# Patient Record
Sex: Female | Born: 1975 | Race: White | Hispanic: No | Marital: Married | Smoking: Former smoker
Health system: Southern US, Community
[De-identification: ages and names within clinical notes are randomized; demographics above are authoritative.]

## PROBLEM LIST (undated history)

## (undated) DIAGNOSIS — Z860100 Personal history of colon polyps, unspecified: Secondary | ICD-10-CM

## (undated) DIAGNOSIS — N8501 Benign endometrial hyperplasia: Secondary | ICD-10-CM

## (undated) DIAGNOSIS — K219 Gastro-esophageal reflux disease without esophagitis: Secondary | ICD-10-CM

## (undated) DIAGNOSIS — K529 Noninfective gastroenteritis and colitis, unspecified: Secondary | ICD-10-CM

## (undated) DIAGNOSIS — K859 Acute pancreatitis without necrosis or infection, unspecified: Secondary | ICD-10-CM

## (undated) DIAGNOSIS — R945 Abnormal results of liver function studies: Secondary | ICD-10-CM

## (undated) HISTORY — DX: Benign endometrial hyperplasia: N85.01

## (undated) HISTORY — PX: TUBAL LIGATION: SHX77

## (undated) HISTORY — PX: CHOLECYSTECTOMY: SHX55

## (undated) HISTORY — DX: Acute pancreatitis without necrosis or infection, unspecified: K85.90

## (undated) HISTORY — PX: TONSILLECTOMY: SUR1361

## (undated) HISTORY — DX: Noninfective gastroenteritis and colitis, unspecified: K52.9

## (undated) HISTORY — DX: Personal history of colon polyps, unspecified: Z86.0100

## (undated) HISTORY — DX: Personal history of colonic polyps: Z86.010

## (undated) HISTORY — PX: WISDOM TOOTH EXTRACTION: SHX21

---

## 1998-08-05 ENCOUNTER — Encounter: Admission: RE | Admit: 1998-08-05 | Discharge: 1998-11-03 | Payer: Self-pay | Admitting: Gynecology

## 1998-09-24 ENCOUNTER — Inpatient Hospital Stay (HOSPITAL_COMMUNITY): Admission: AD | Admit: 1998-09-24 | Discharge: 1998-09-24 | Payer: Self-pay | Admitting: *Deleted

## 1998-10-02 ENCOUNTER — Inpatient Hospital Stay (HOSPITAL_COMMUNITY): Admission: AD | Admit: 1998-10-02 | Discharge: 1998-10-02 | Payer: Self-pay | Admitting: Obstetrics and Gynecology

## 1998-10-22 ENCOUNTER — Inpatient Hospital Stay (HOSPITAL_COMMUNITY): Admission: AD | Admit: 1998-10-22 | Discharge: 1998-10-25 | Payer: Self-pay | Admitting: Gynecology

## 1999-12-08 ENCOUNTER — Other Ambulatory Visit: Admission: RE | Admit: 1999-12-08 | Discharge: 1999-12-08 | Payer: Self-pay | Admitting: Gynecology

## 2000-06-14 ENCOUNTER — Inpatient Hospital Stay (HOSPITAL_COMMUNITY): Admission: AD | Admit: 2000-06-14 | Discharge: 2000-06-14 | Payer: Self-pay | Admitting: Gynecology

## 2001-05-23 ENCOUNTER — Encounter (INDEPENDENT_AMBULATORY_CARE_PROVIDER_SITE_OTHER): Payer: Self-pay

## 2002-12-16 ENCOUNTER — Encounter: Admission: RE | Admit: 2002-12-16 | Discharge: 2002-12-16 | Payer: Self-pay | Admitting: Internal Medicine

## 2003-02-10 ENCOUNTER — Inpatient Hospital Stay (HOSPITAL_COMMUNITY): Admission: AD | Admit: 2003-02-10 | Discharge: 2003-02-10 | Payer: Self-pay | Admitting: Gynecology

## 2004-12-29 ENCOUNTER — Other Ambulatory Visit: Admission: RE | Admit: 2004-12-29 | Discharge: 2004-12-29 | Payer: Self-pay | Admitting: Gynecology

## 2005-05-03 ENCOUNTER — Encounter: Admission: RE | Admit: 2005-05-03 | Discharge: 2005-08-01 | Payer: Self-pay | Admitting: Gynecology

## 2005-07-19 ENCOUNTER — Inpatient Hospital Stay (HOSPITAL_COMMUNITY): Admission: AD | Admit: 2005-07-19 | Discharge: 2005-07-19 | Payer: Self-pay | Admitting: Gynecology

## 2005-08-02 ENCOUNTER — Inpatient Hospital Stay (HOSPITAL_COMMUNITY): Admission: RE | Admit: 2005-08-02 | Discharge: 2005-08-05 | Payer: Self-pay | Admitting: Gynecology

## 2005-08-02 ENCOUNTER — Encounter (INDEPENDENT_AMBULATORY_CARE_PROVIDER_SITE_OTHER): Payer: Self-pay | Admitting: *Deleted

## 2005-09-16 ENCOUNTER — Other Ambulatory Visit: Admission: RE | Admit: 2005-09-16 | Discharge: 2005-09-16 | Payer: Self-pay | Admitting: Gynecology

## 2005-10-03 ENCOUNTER — Emergency Department (HOSPITAL_COMMUNITY): Admission: EM | Admit: 2005-10-03 | Discharge: 2005-10-03 | Payer: Self-pay | Admitting: Emergency Medicine

## 2005-11-07 DIAGNOSIS — K859 Acute pancreatitis without necrosis or infection, unspecified: Secondary | ICD-10-CM

## 2005-11-07 HISTORY — DX: Acute pancreatitis without necrosis or infection, unspecified: K85.90

## 2006-09-19 ENCOUNTER — Other Ambulatory Visit: Admission: RE | Admit: 2006-09-19 | Discharge: 2006-09-19 | Payer: Self-pay | Admitting: Gynecology

## 2007-09-21 ENCOUNTER — Other Ambulatory Visit: Admission: RE | Admit: 2007-09-21 | Discharge: 2007-09-21 | Payer: Self-pay | Admitting: Gynecology

## 2007-11-08 DIAGNOSIS — K529 Noninfective gastroenteritis and colitis, unspecified: Secondary | ICD-10-CM

## 2007-11-08 HISTORY — DX: Noninfective gastroenteritis and colitis, unspecified: K52.9

## 2007-12-16 ENCOUNTER — Inpatient Hospital Stay: Payer: Self-pay | Admitting: Internal Medicine

## 2008-09-24 ENCOUNTER — Encounter: Payer: Self-pay | Admitting: Women's Health

## 2008-09-24 ENCOUNTER — Ambulatory Visit: Payer: Self-pay | Admitting: Women's Health

## 2008-09-24 ENCOUNTER — Other Ambulatory Visit: Admission: RE | Admit: 2008-09-24 | Discharge: 2008-09-24 | Payer: Self-pay | Admitting: Gynecology

## 2009-09-25 ENCOUNTER — Other Ambulatory Visit: Admission: RE | Admit: 2009-09-25 | Discharge: 2009-09-25 | Payer: Self-pay | Admitting: Gynecology

## 2009-09-25 ENCOUNTER — Ambulatory Visit: Payer: Self-pay | Admitting: Gynecology

## 2009-09-28 ENCOUNTER — Ambulatory Visit: Payer: Self-pay | Admitting: Gynecology

## 2009-10-14 IMAGING — CT CT ABD-PELV W/O CM
2 of 3 series · 16 of 42 positions shown, 19 images · non-contrast
Comparison: none

REASON FOR EXAM: (1) rlq pain; (2) rlq pain
COMMENTS:

[Series 2: stone · axial · 0.75mm/px · z∈[-950,-504]mm · 13 of 167 slices shown, 16 images]
[im 12/167  soft-tissue]
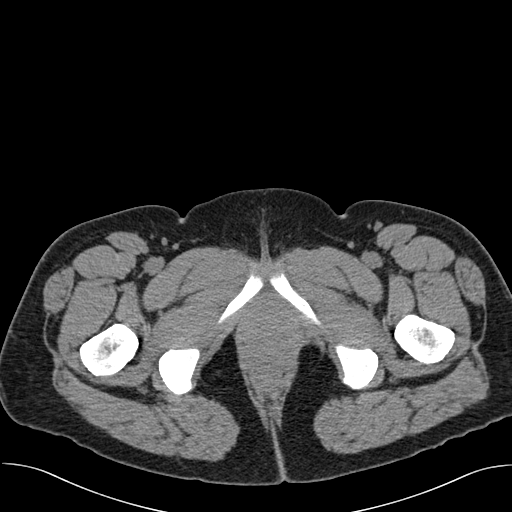
[im 12/167  bone]
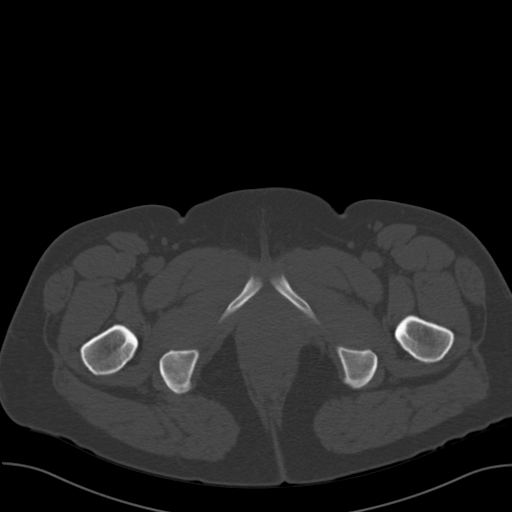
[im 30/167  soft-tissue]
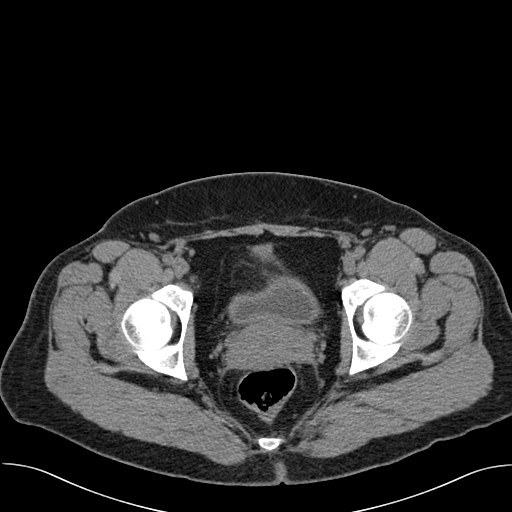
[im 42/167  soft-tissue]
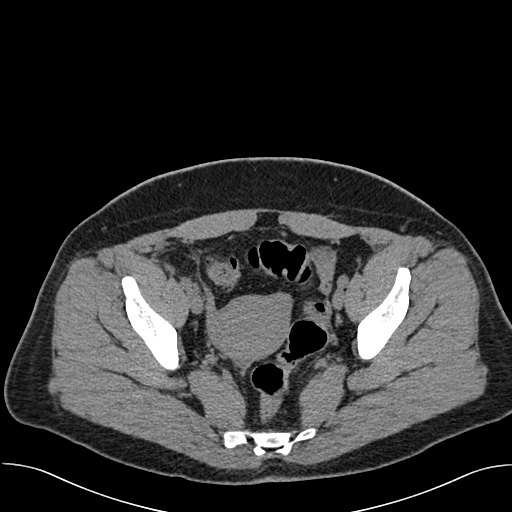
[im 60/167  soft-tissue]
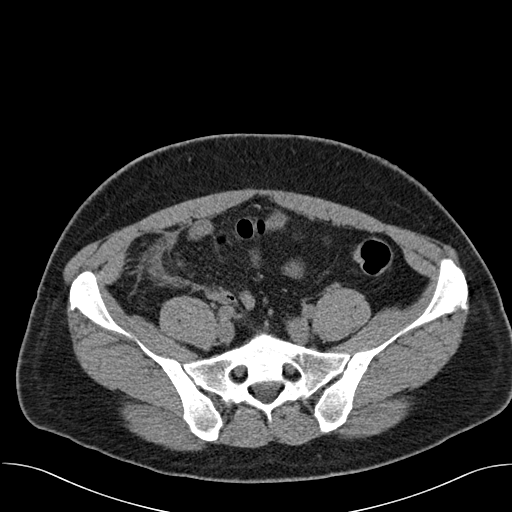
[im 78/167  soft-tissue]
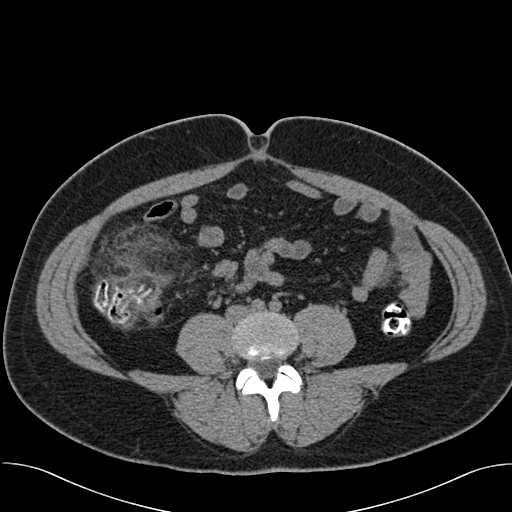
[im 89/167  soft-tissue]
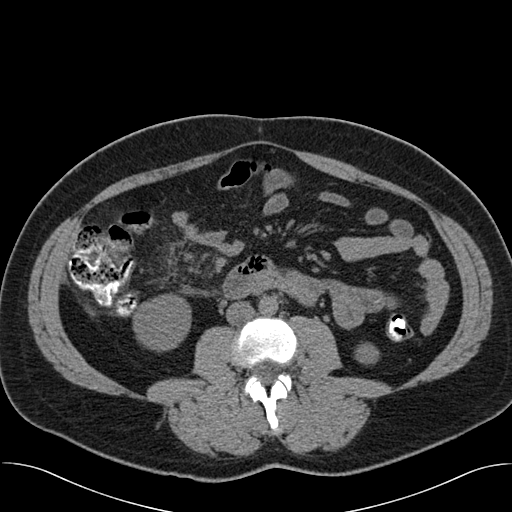
[im 107/167  soft-tissue]
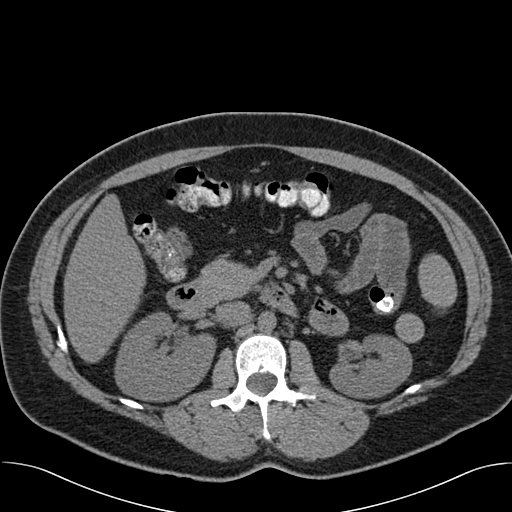
[im 125/167  soft-tissue]
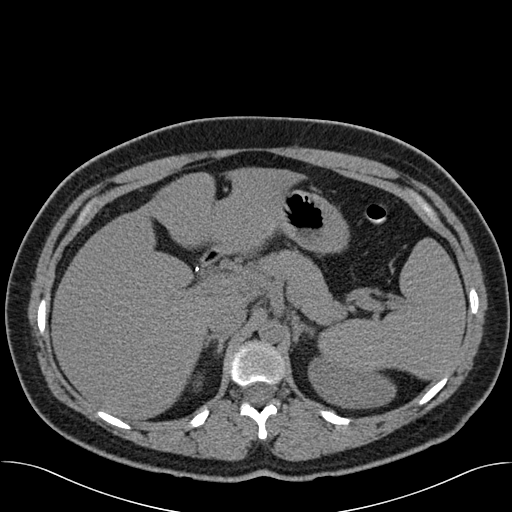
[im 137/167  soft-tissue]
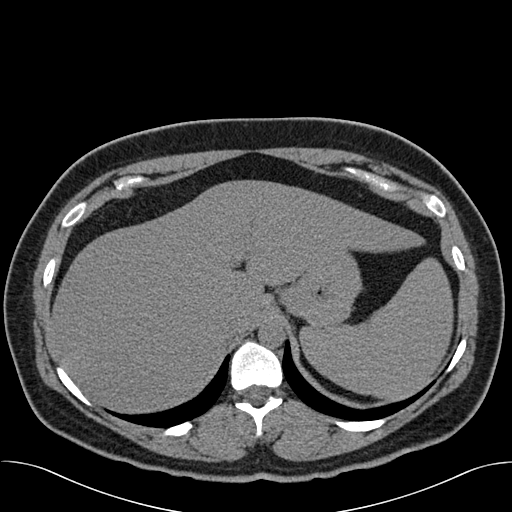
[im 137/167  bone]
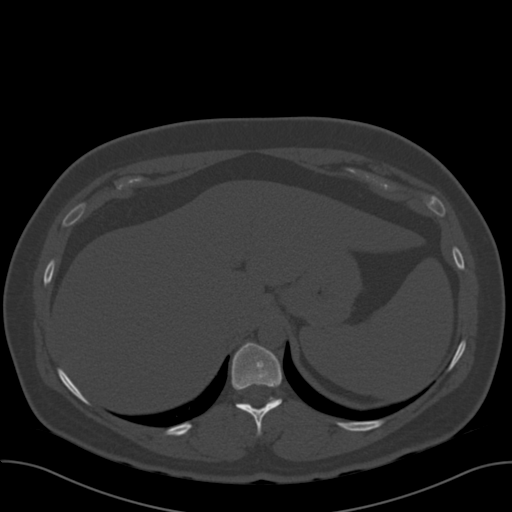
[im 143/167  lung]
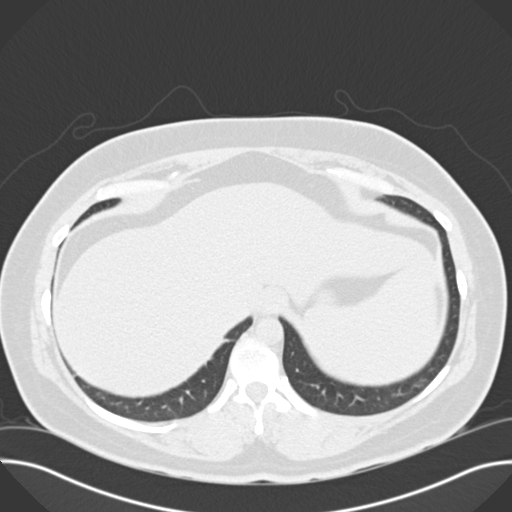
[im 149/167  lung]
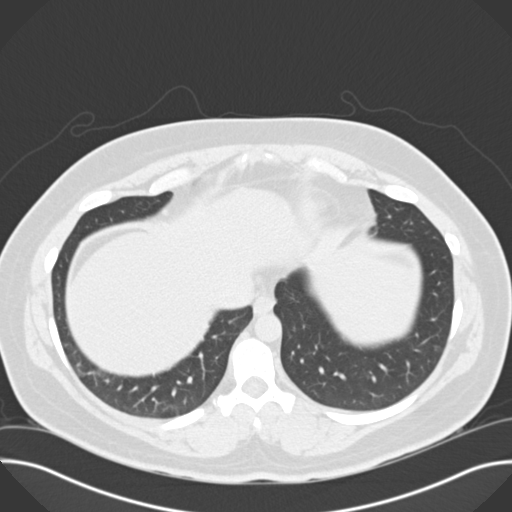
[im 155/167  soft-tissue]
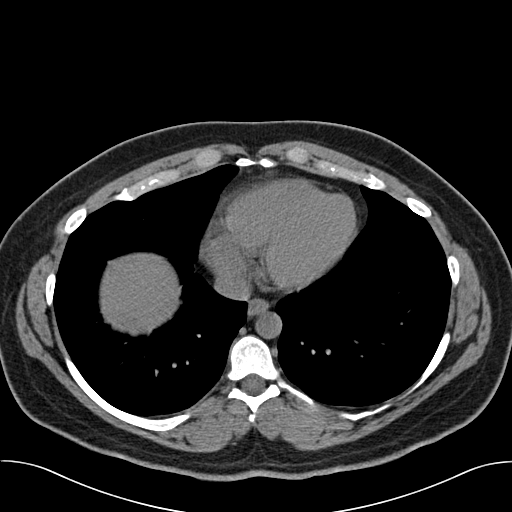
[im 155/167  lung]
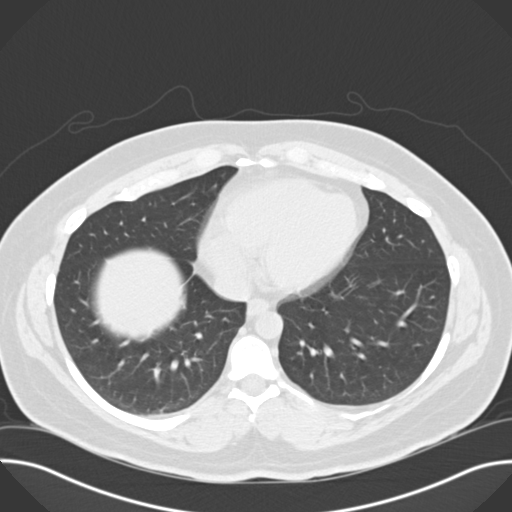
[im 161/167  lung]
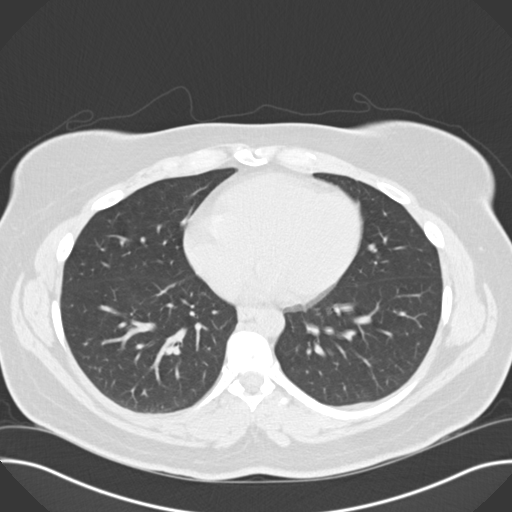

[Series 602: coronals · coronal · 0.99mm/px · 3 of 85 slices shown]
[im 29/85  soft-tissue]
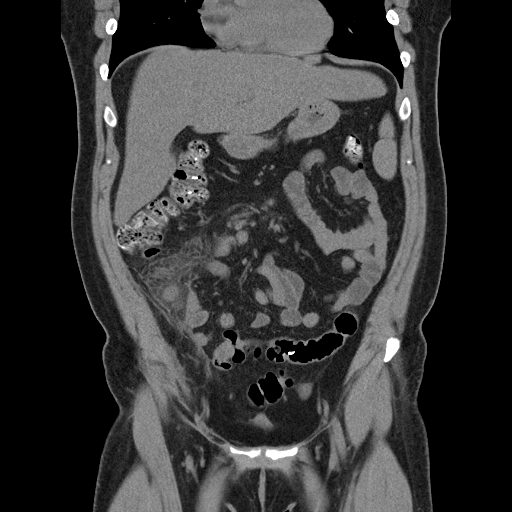
[im 38/85  soft-tissue]
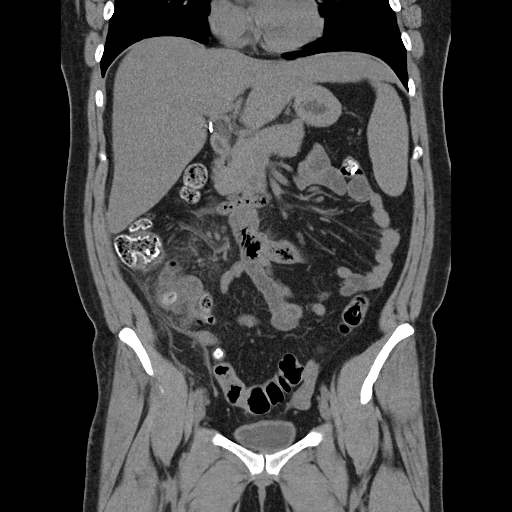
[im 47/85  soft-tissue]
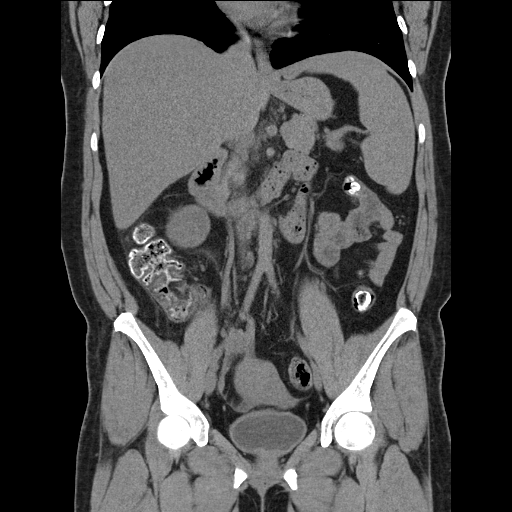

[16 of 42 positions shown; findings below may reference images not displayed]

PROCEDURE:     CT  - CT ABDOMEN AND PELVIS W[DATE]  [DATE]

RESULT:     Noncontrast emergent CT of the abdomen and pelvis is performed.
Images are reconstructed at 3 mm slice thickness in the axial plane. There
is no previous exam for comparison.

The images demonstrate extensive inflammatory change in the anterior
pericecal region. This is most pronounced on images 95 through 100. The
changes could be secondary to diverticulitis or epiploic appendagitis..
Appendicitis is felt to be unlikely. It appears that there is a normal
appearing appendix from image 105 to 112 extending medially from the cecal
tip almost to the midline on image 110. There is some density within loops
of bowel likely secondary to antacid use. There is no definite perforation
or abscess at this time. The possibility of an inflammatory process such as
colitis even Crohn's colitis could be considered but given the asymmetric
localization anterior to the cecum that is felt to be less likely.

The uterus is present to the right of midline. There is no free fluid or
free air. There is no abnormal bowel distention. The abdominal aorta is
normal in caliber. The kidneys are unremarkable. Cholecystectomy clips are
present. The spleen is borderline enlarged. The liver, pancreas and adrenal
glands are unremarkable. The lung bases are clear.
IMPRESSION: 1. Prominent inflammatory response in the anterior pericecal region which
may be secondary to diverticulitis or epiploic appendagitis.  . Appendicitis
is felt to be less likely. There appears to be a normal appendix medial to
the cecum without definite evidence of periappendiceal inflammatory change.
Colitis is also a consideration but is also felt to be less likely.

2. Cholecystectomy changes.

## 2009-11-07 DIAGNOSIS — N8501 Benign endometrial hyperplasia: Secondary | ICD-10-CM

## 2009-11-07 HISTORY — DX: Benign endometrial hyperplasia: N85.01

## 2010-05-19 ENCOUNTER — Encounter: Admission: RE | Admit: 2010-05-19 | Discharge: 2010-05-20 | Payer: Self-pay | Admitting: Obstetrics and Gynecology

## 2010-07-09 ENCOUNTER — Inpatient Hospital Stay (HOSPITAL_COMMUNITY): Admission: AD | Admit: 2010-07-09 | Discharge: 2010-07-09 | Payer: Self-pay | Admitting: Obstetrics and Gynecology

## 2010-07-16 ENCOUNTER — Other Ambulatory Visit: Payer: Self-pay | Admitting: Obstetrics and Gynecology

## 2010-07-21 ENCOUNTER — Inpatient Hospital Stay (HOSPITAL_COMMUNITY): Admission: AD | Admit: 2010-07-21 | Discharge: 2010-07-23 | Payer: Self-pay | Admitting: Obstetrics and Gynecology

## 2011-01-20 LAB — CBC
HCT: 29.2 % — ABNORMAL LOW (ref 36.0–46.0)
Hemoglobin: 10.5 g/dL — ABNORMAL LOW (ref 12.0–15.0)
Hemoglobin: 12.6 g/dL (ref 12.0–15.0)
MCH: 34.5 pg — ABNORMAL HIGH (ref 26.0–34.0)
MCHC: 35 g/dL (ref 30.0–36.0)
Platelets: 131 10*3/uL — ABNORMAL LOW (ref 150–400)
RBC: 3.04 MIL/uL — ABNORMAL LOW (ref 3.87–5.11)
RDW: 13.7 % (ref 11.5–15.5)
RDW: 13.9 % (ref 11.5–15.5)
WBC: 7.8 10*3/uL (ref 4.0–10.5)
WBC: 8 10*3/uL (ref 4.0–10.5)

## 2011-01-20 LAB — GLUCOSE, CAPILLARY
Glucose-Capillary: 105 mg/dL — ABNORMAL HIGH (ref 70–99)
Glucose-Capillary: 130 mg/dL — ABNORMAL HIGH (ref 70–99)
Glucose-Capillary: 63 mg/dL — ABNORMAL LOW (ref 70–99)

## 2011-01-20 LAB — BASIC METABOLIC PANEL
Calcium: 8.7 mg/dL (ref 8.4–10.5)
Chloride: 104 mEq/L (ref 96–112)
Creatinine, Ser: 0.46 mg/dL (ref 0.4–1.2)
GFR calc Af Amer: >60 mL/min (ref >60)

## 2011-01-20 LAB — SURGICAL PCR SCREEN
MRSA, PCR: NEGATIVE
Staphylococcus aureus: NEGATIVE

## 2011-01-20 LAB — RPR
RPR Ser Ql: NONREACTIVE
RPR Ser Ql: NONREACTIVE

## 2011-03-25 NOTE — H&P (Signed)
Regina Clark, Regina Clark                ACCOUNT NO.:  [id number]   MEDICAL RECORD NO.:  [id number]          PATIENT TYPE:  INP   LOCATION:  NA                            FACILITY:  [facility]   PHYSICIAN:  Timothy P. Fontaine, M.D.DATE OF BIRTH:  04-15-1976   DATE OF ADMISSION:  DATE OF DISCHARGE:                                HISTORY & PHYSICAL   CHIEF COMPLAINT:  1.  Pregnancy at term.  2.  Gestational diabetes, diet controlled.  3.  Fetal macrosomia.   HISTORY OF PRESENT ILLNESS:  This is a 35 year old G2, P69 female at 74  weeks' gestation who was found on clinical exam to have a size greater than  dates, underwent ultrasound screening and was found to have an estimated  fetal weight of 4500 g.  The patient is being followed for gestational  diabetes, diet controlled, has a reactive NST, normal AFI, and the issues of  trial of labor versus primary cesarean section due to fetal macrosomia, risk  of shoulder dystocia, and fetal injury were all discussed with her.  Her  first pregnancy was delivered at 7 pounds 2 ounces, was noted historically  to have some difficulty with delivery requiring forceps, and after a lengthy  discussion the patient elects for a primary cesarean section.  For the  remainder of her history see her [place].   PHYSICAL EXAMINATION:  HEENT:  Normal.  LUNGS:  Clear.  CARDIAC:  Regular rate.  No rubs, murmurs, or gallops.  ABDOMEN:  Exam benign with a gravid vertex fetus.  External monitor is  reactive fetal tracing, irregular contractions.  PELVIC:  Deferred.   ASSESSMENT:  55.  A 35 year old G2, P35 female, large infant on Leopold's.  Ultrasound      confirms 4500 g fetus.  2.  History of gestational diabetes.  3.  History of difficult delivery with 7 pound 2 ounce prior birth.      Reviewed options, risks, benefits of cesarean section versus attempted      vaginal delivery and the patient elects for a cesarean section.  I      reviewed what is involved with the  procedure, expected intraoperative      and postoperative courses and the risks include infection, both      internal, __________antibiotics, incisional requiring opening and      draining of incisions and closure by secondary intention.  The risks of      hemorrhage necessitating transfusion and the risks of transfusion      including transfusion reaction, hepatitis, HIV, mad cow disease, and      other unknown entities were discussed, understood, and accepted.  Risks      of inadvertent injury to internal organs including bowel, bladder,      ureters, vessels, and nerves necessitating major exploratory reparative      surgeries and future reparative surgeries including ostomy      formation, was all discussed, understood, and accepted.  The risks of      fetal injury including musculoskeletal, neural, scalpel injuries were      all discussed, understood,  and accepted.  The patient's questions were      answered to her satisfaction and she is ready to proceed with surgery.      Timothy P. Fontaine, M.D.  Electronically Signed     TPF/MEDQ  D:  08/01/2005  T:  08/01/2005  Job:  [id number]

## 2011-03-25 NOTE — H&P (Signed)
NAMEDARYL, QUIROS                ACCOUNT NO.:  [id number]   MEDICAL RECORD NO.:  [id number]          PATIENT TYPE:  INP   LOCATION:  9134                          FACILITY:  [facility]   PHYSICIAN:  Juan H. Lily Peer, M.D.DATE OF BIRTH:  02-19-1976   DATE OF ADMISSION:  08/02/2005  DATE OF DISCHARGE:  08/05/2005                                HISTORY & PHYSICAL   HISTORY:  The patient is a 35 year old gravida 2, para 1 who underwent a  primary cesarean section secondary to gestational diabetes, suspected fetal  macrosomia, and at term.  Patient delivered a viable female infant, weight 9  pounds 13 ounces, Apgars at 9 and 9.  Postoperatively patient did well.  Her  first postoperative day her hemoglobin and hematocrit were 10.6 and 31.2.  Blood type was O+.  Rubella immune.  Her diet was gradually advanced.  Her  Foley catheter had been discontinued.  She was up and ambulating, ready to  be discharged home on the third postoperative day.   FINAL DIAGNOSES:  1.  Term intrauterine pregnancy.  2.  Gestational diabetes, diet controlled.  3.  Suspected fetal macrosomia.   PROCEDURES PERFORMED:  Primary lower uterine segment transverse cesarean  section by Dr. Colin Broach.   FINAL DISPOSITION:  Patient was discharged home on her third postoperative  day.  She was given a prescription for Lortab 7.5/500 to take one p.o. q.4-  6h. p.r.n. pain also interchanged with Motrin 800 mg t.i.d.  She was  encouraged to continue her prenatal vitamins and iron and follow up in  [facility] in six weeks for a postoperative visit.  Discharge  instructions were provided as well.      Juan H. Lily Peer, M.D.  Electronically Signed     JHF/MEDQ  D:  09/07/2005  T:  09/07/2005  Job:  [id number]

## 2011-03-25 NOTE — Op Note (Signed)
NAMEANNI, HOCEVAR                ACCOUNT NO.:  [id number]   MEDICAL RECORD NO.:  [id number]          PATIENT TYPE:  INP   LOCATION:  9134                          FACILITY:  [facility]   PHYSICIAN:  Timothy P. Fontaine, M.D.DATE OF BIRTH:  Dec 20, 1975   DATE OF PROCEDURE:  08/02/2005  DATE OF DISCHARGE:                                 OPERATIVE REPORT   PREOPERATIVE DIAGNOSES:  Term pregnancy, gestational diabetes, fetal  macrosomia.   POSTOPERATIVE DIAGNOSES:  Term pregnancy, gestational diabetes, fetal  macrosomia.   PROCEDURE:  Primary low transverse cervical cesarean section.   SURGEON:  Timothy P. Fontaine, M.D.   ASSISTANT:  Ivor Costa. Farrel Gobble, M.D.   ANESTHETIC:  Spinal.   ESTIMATED BLOOD LOSS:  Less than 500 mL.   COMPLICATIONS:  None.   SPECIMEN:  Samples of cord blood, cord blood banking, placenta.   FINDINGS:  At 70 a normal female, Apgars 09/09, weight 9 pounds 13 ounces.  Uterus not exteriorized, adnexa palpably normal, not visualized.   PROCEDURE:  The patient was taken to the operating room, underwent spinal  anesthesia was placed left tilt supine position, received abdominal  preparation with Betadine solution. Foley catheter placed in sterile  technique per nursing personnel. The patient was draped in usual fashion  after assuring adequate anesthesia. The abdomen sharply entered through a  Pfannenstiel incision achieving adequate hemostasis at all levels. Bladder  flap was sharply bluntly developed without difficulty. The uterus was  sharply entered in the lower uterine segment bluntly extended laterally. The  bulging membranes were ruptured. Fluid noted to be clear. The infant's head  delivered through the incision. Nares and mouth suctioned, rest of the  infant delivered. Cord doubly clamped and cut. Infant handed to pediatrics  in attendance. Samples of cord blood were obtained. Placenta was then  spontaneously extruded and passed off for cord blood banking  retrieval. The  endometrial cavity was explored with a sponge to remove all placental  membrane fragments. Due to the size of the uterus it was not exteriorized  and the uterine incision was subsequently closed in two layers using 0  Vicryl suture first a running interlocking stitch followed by imbricating  stitch. The pelvis was copiously irrigated. Adequate hemostasis was  visualized. The anterior fascia reapproximated using 0 Vicryl suture  in running stitch. Subcutaneous tissues irrigated. Adequate hemostasis  achieved with electrocautery. Skin reapproximated using 4-0 Vicryl running  subcuticular stitch. Steri-Strips, Benzoin applied. Sterile dressing  applied. The patient taken to recovery room in good condition having  tolerated procedure well      Timothy P. Fontaine, M.D.  Electronically Signed     TPF/MEDQ  D:  08/02/2005  T:  08/02/2005  Job:  [id number]

## 2011-12-14 ENCOUNTER — Encounter: Payer: Self-pay | Admitting: Gynecology

## 2011-12-19 ENCOUNTER — Encounter: Payer: Self-pay | Admitting: Gynecology

## 2011-12-19 DIAGNOSIS — N8501 Benign endometrial hyperplasia: Secondary | ICD-10-CM | POA: Insufficient documentation

## 2011-12-26 ENCOUNTER — Ambulatory Visit (INDEPENDENT_AMBULATORY_CARE_PROVIDER_SITE_OTHER): Payer: Managed Care, Other (non HMO) | Admitting: Gynecology

## 2011-12-26 ENCOUNTER — Other Ambulatory Visit (HOSPITAL_COMMUNITY)
Admission: RE | Admit: 2011-12-26 | Discharge: 2011-12-26 | Disposition: A | Discharge status: Home / Self Care | Payer: Managed Care, Other (non HMO) | Source: Ambulatory Visit | Attending: Gynecology | Admitting: Gynecology

## 2011-12-26 ENCOUNTER — Encounter: Payer: Self-pay | Admitting: Gynecology

## 2011-12-26 VITALS — BP 116/82 | Ht 66.25 in | Wt 221.0 lb

## 2011-12-26 DIAGNOSIS — Z01419 Encounter for gynecological examination (general) (routine) without abnormal findings: Secondary | ICD-10-CM

## 2011-12-26 DIAGNOSIS — Z13220 Encounter for screening for lipoid disorders: Secondary | ICD-10-CM

## 2011-12-26 DIAGNOSIS — K529 Noninfective gastroenteritis and colitis, unspecified: Secondary | ICD-10-CM | POA: Insufficient documentation

## 2011-12-26 DIAGNOSIS — Z131 Encounter for screening for diabetes mellitus: Secondary | ICD-10-CM

## 2011-12-26 DIAGNOSIS — Z86010 Personal history of colonic polyps: Secondary | ICD-10-CM | POA: Insufficient documentation

## 2011-12-26 LAB — LIPID PANEL
Cholesterol: 187 mg/dL (ref 0–200)
VLDL: 57 mg/dL — ABNORMAL HIGH (ref 0–40)

## 2011-12-26 LAB — URINALYSIS W MICROSCOPIC + REFLEX CULTURE
Bilirubin Urine: NEGATIVE
Specific Gravity, Urine: 1.02 (ref 1.005–1.030)
Urobilinogen, UA: 0.2 mg/dL (ref 0.0–1.0)

## 2011-12-26 LAB — GLUCOSE, RANDOM: Glucose, Bld: 83 mg/dL (ref 70–99)

## 2011-12-26 NOTE — Progress Notes (Signed)
Regina Clark 12-Mar-1976 [id number]        36 y.o.  for annual exam.  Overall doing well. Had repeat C-section and BTL last year.  Past medical history,surgical history, medications, allergies, family history and social history were all reviewed and documented in the EPIC chart. ROS:  Was performed and pertinent positives and negatives are included in the history.  Exam: [name] chaperone present Filed Vitals:   12/26/11 1545  BP: 116/82   General appearance  Normal Skin grossly normal Head/Neck normal with no cervical or supraclavicular adenopathy thyroid normal Lungs  clear Cardiac RR, without RMG Abdominal  soft, nontender, without masses, organomegaly or hernia Breasts  examined lying and sitting without masses, retractions, discharge or axillary adenopathy. Pelvic  Ext/BUS/vagina  normal   Cervix  normal  Pap done  Uterus  axial, normal size, shape and contour, midline and mobile nontender   Adnexa  Without masses or tenderness    Anus and perineum  normal   Rectovaginal  normal sphincter tone without palpated masses or tenderness.    Assessment/Plan:  36 y.o. female for annual exam.   Doing well. Regular menses. Tubal sterilization. 1. History hypoplastic endometrium. Patient had a sonohysterogram that was normal November 2010 due to heavy menses. The endometrial sample showed small fragments of crowded endometrial glands consistent with simple hyperplasia no evidence of cytologic atypia or malignancy. This is in the background of mostly benign unremarkable proliferative endometrium. The initial plan was to resample her in a year feeling that this really was not of significance she was having regular menses.  She's had an intervening pregnancy an issue about resampling her now was discussed. I think given the total picture of intervening pregnancy now with regular menses that we can forego the rebiopsy and she's comfortable with this. 2. Colonoscopy. Patient's father died of colon  cancer. He was diagnosed at age 44. She had a colonoscopy previously with polyps and they advised her to do it every 5 years and she is due now. She's to call [facility] where she had her last colonoscopy done and arrange for this. If she has any issues trying to arrange for this she knows to call me and I will facilitate this. 3. Mammography. I reviewed current screening guidelines between 35 and 40 to get her baseline mammogram. Patient has no family history of breast cancer and prefers to wait closer to 40. SBE monthly reviewed. 4. Pap smear. Pap smear was done today. She has numerous normal reports in her chart and assuming this is normal then we'll plan on less frequent screening every 3 years per current screening guidelines. 5. Health maintenance. Baseline CBC glucose urinalysis lipid profile ordered. Patient will follow up with me in one year assuming she continues well, sooner as needed.   [name] MD, 4:12 PM 12/26/2011

## 2011-12-26 NOTE — Patient Instructions (Signed)
Make arrangements for colonoscopy. Call if you're having problems doing so. Follow for annual gynecologic exam in one year.

## 2011-12-27 LAB — CBC WITH DIFFERENTIAL/PLATELET
Basophils Relative: 0 % (ref 0–1)
Eosinophils Absolute: 0.2 10*3/uL (ref 0.0–0.7)
MCH: 31.7 pg (ref 26.0–34.0)
MCHC: 34.1 g/dL (ref 30.0–36.0)
Monocytes Relative: 7 % (ref 3–12)
Neutrophils Relative %: 60 % (ref 43–77)
Platelets: 194 10*3/uL (ref 150–400)
RDW: 12.5 % (ref 11.5–15.5)

## 2013-10-30 ENCOUNTER — Encounter: Payer: Self-pay | Admitting: Gynecology

## 2013-10-30 ENCOUNTER — Ambulatory Visit (INDEPENDENT_AMBULATORY_CARE_PROVIDER_SITE_OTHER): Payer: Managed Care, Other (non HMO) | Admitting: Gynecology

## 2013-10-30 VITALS — BP 116/76 | Ht 66.5 in | Wt 224.0 lb

## 2013-10-30 DIAGNOSIS — Z01419 Encounter for gynecological examination (general) (routine) without abnormal findings: Secondary | ICD-10-CM

## 2013-10-30 DIAGNOSIS — N899 Noninflammatory disorder of vagina, unspecified: Secondary | ICD-10-CM

## 2013-10-30 DIAGNOSIS — N898 Other specified noninflammatory disorders of vagina: Secondary | ICD-10-CM

## 2013-10-30 LAB — LIPID PANEL
Cholesterol: 176 mg/dL (ref 0–200)
HDL: 33 mg/dL — ABNORMAL LOW
LDL Cholesterol: 93 mg/dL (ref 0–99)
Total CHOL/HDL Ratio: 5.3 ratio
Triglycerides: 248 mg/dL — ABNORMAL HIGH
VLDL: 50 mg/dL — ABNORMAL HIGH (ref 0–40)

## 2013-10-30 LAB — WET PREP FOR TRICH, YEAST, CLUE
Trich, Wet Prep: NONE SEEN
WBC, Wet Prep HPF POC: NONE SEEN

## 2013-10-30 LAB — CBC WITH DIFFERENTIAL/PLATELET
Basophils Absolute: 0 10*3/uL (ref 0.0–0.1)
HCT: 39.8 % (ref 36.0–46.0)
Lymphocytes Relative: 34 % (ref 12–46)
Monocytes Absolute: 0.4 10*3/uL (ref 0.1–1.0)
Neutro Abs: 3.5 10*3/uL (ref 1.7–7.7)
Neutrophils Relative %: 57 % (ref 43–77)
Platelets: 192 10*3/uL (ref 150–400)
RDW: 13.3 % (ref 11.5–15.5)
WBC: 6.1 10*3/uL (ref 4.0–10.5)

## 2013-10-30 LAB — COMPREHENSIVE METABOLIC PANEL
ALT: 38 U/L — ABNORMAL HIGH (ref 0–35)
AST: 33 U/L (ref 0–37)
Albumin: 4.2 g/dL (ref 3.5–5.2)
Calcium: 8.7 mg/dL (ref 8.4–10.5)
Chloride: 103 mEq/L (ref 96–112)
Potassium: 3.8 mEq/L (ref 3.5–5.3)
Sodium: 136 mEq/L (ref 135–145)
Total Protein: 6.8 g/dL (ref 6.0–8.3)

## 2013-10-30 MED ORDER — CLINDAMYCIN PHOSPHATE 2 % VA CREA: 1.0000 | TOPICAL_CREAM | Freq: Every day | VAGINAL | Status: DC

## 2013-10-30 NOTE — Progress Notes (Signed)
Regina Clark Feb 25, 1976 [id number]        37 y.o.  G3P3003 for annual exam.  Overall doing well. Does note some vaginal irritation. Slight discharge.  Past medical history,surgical history, problem list, medications, allergies, family history and social history were all reviewed and documented in the EPIC chart.  ROS:  Performed and pertinent positives and negatives are included in the history, assessment and plan .  Exam: Regina Clark assistant Filed Vitals:   10/30/13 1201  BP: 116/76  Height: 5' 6.5" (1.689 m)  Weight: 224 lb (101.606 kg)   General appearance  Normal Skin grossly normal Head/Neck normal with no cervical or supraclavicular adenopathy thyroid normal Lungs  clear Cardiac RR, without RMG Abdominal  soft, nontender, without masses, organomegaly or hernia Breasts  examined lying and sitting without masses, retractions, discharge or axillary adenopathy. Pelvic  Ext/BUS/vagina  Normal with slight white discharge.  Cervix  Normal   Uterus  anteverted, normal size, shape and contour, midline and mobile nontender   Adnexa  Without masses or tenderness    Anus and perineum  Normal   Rectovaginal  Normal sphincter tone without palpated masses or tenderness.    Assessment/Plan:  37 y.o. G59P3003 female for annual exam, regular menses, tubal sterilization.   1. Vaginal irritation. Slight white discharge on exam. Wet prep consistent with bacterial vaginosis. Options for management reviewed and she elects for Cleocin vaginal cream nightly x7 days. Followup if symptoms persist, worsen or recur. 2. History of simple endometrial hyperplasia 2011 when she was having irregular menses. She has had a subsequent pregnancy with regular monthly menses. We elected to monitor and not rebiopsy in the past. Her menses continue regular monthly and we will continue to monitor. She'll followup if any irregular bleeding. 3. Mammography. Discussed screening mammographic guidelines between 35 and 40. She  has a strong family history prefers to wait closer to 40. SBE monthly reviewed. 4. Pap smear 2013. No Pap smear done today. No history of significant abnormal Pap smears. Plan repeat Pap smear at 3 year interval. 5. Colonoscopy scheduled for February 2015 due to strong family history of colon cancer in her father diagnosed at age 26. She'll followup for this. 6. Health maintenance. Baseline CBC comprehensive metabolic panel lipid profile urinalysis ordered. Followup in one year, sooner as needed.   Note: This document was prepared with digital dictation and possible smart phrase technology. Any transcriptional errors that result from this process are unintentional.   Regina Lords MD, 12:34 PM 10/30/2013

## 2013-10-30 NOTE — Patient Instructions (Signed)
Use vaginal cream nightly for 7 days. Followup if symptoms persist, worsen or recur. Followup in one year for annual exam.

## 2013-10-31 LAB — URINALYSIS W MICROSCOPIC + REFLEX CULTURE
Casts: NONE SEEN
Glucose, UA: NEGATIVE mg/dL
Leukocytes, UA: NEGATIVE
Nitrite: NEGATIVE
Protein, ur: NEGATIVE mg/dL
pH: 5.5 (ref 5.0–8.0)

## 2013-11-04 ENCOUNTER — Other Ambulatory Visit: Payer: Self-pay | Admitting: Gynecology

## 2013-11-04 DIAGNOSIS — E781 Pure hyperglyceridemia: Secondary | ICD-10-CM

## 2013-11-11 ENCOUNTER — Encounter: Payer: Self-pay | Admitting: Gynecology

## 2013-12-18 ENCOUNTER — Other Ambulatory Visit: Payer: Managed Care, Other (non HMO)

## 2013-12-18 DIAGNOSIS — R7989 Other specified abnormal findings of blood chemistry: Secondary | ICD-10-CM

## 2013-12-18 DIAGNOSIS — E781 Pure hyperglyceridemia: Secondary | ICD-10-CM

## 2013-12-18 DIAGNOSIS — R945 Abnormal results of liver function studies: Secondary | ICD-10-CM

## 2013-12-18 LAB — LIPID PANEL
CHOL/HDL RATIO: 5.6 ratio
Cholesterol: 178 mg/dL (ref 0–200)
HDL: 32 mg/dL — AB (ref >39)
LDL CALC: 101 mg/dL — AB (ref 0–99)
Triglycerides: 225 mg/dL — ABNORMAL HIGH (ref <150)
VLDL: 45 mg/dL — ABNORMAL HIGH (ref 0–40)

## 2013-12-19 ENCOUNTER — Other Ambulatory Visit: Payer: Self-pay | Admitting: Gynecology

## 2013-12-19 DIAGNOSIS — R7989 Other specified abnormal findings of blood chemistry: Secondary | ICD-10-CM

## 2013-12-19 DIAGNOSIS — R945 Abnormal results of liver function studies: Principal | ICD-10-CM

## 2013-12-19 LAB — COMPREHENSIVE METABOLIC PANEL
ALK PHOS: 56 U/L (ref 39–117)
ALT: 66 U/L — AB (ref 0–35)
AST: 76 U/L — ABNORMAL HIGH (ref 0–37)
Albumin: 3.8 g/dL (ref 3.5–5.2)
BILIRUBIN TOTAL: 2.8 mg/dL — AB (ref 0.2–1.2)
BUN: 7 mg/dL (ref 6–23)
CO2: 28 meq/L (ref 19–32)
Calcium: 8.7 mg/dL (ref 8.4–10.5)
Chloride: 103 mEq/L (ref 96–112)
Creat: 0.67 mg/dL (ref 0.50–1.10)
Glucose, Bld: 108 mg/dL — ABNORMAL HIGH (ref 70–99)
Potassium: 3.9 mEq/L (ref 3.5–5.3)
SODIUM: 138 meq/L (ref 135–145)
TOTAL PROTEIN: 6.7 g/dL (ref 6.0–8.3)

## 2013-12-20 ENCOUNTER — Other Ambulatory Visit: Payer: Managed Care, Other (non HMO)

## 2013-12-20 DIAGNOSIS — R945 Abnormal results of liver function studies: Principal | ICD-10-CM

## 2013-12-20 DIAGNOSIS — R7989 Other specified abnormal findings of blood chemistry: Secondary | ICD-10-CM

## 2013-12-20 LAB — HEPATITIS PANEL, ACUTE
HCV Ab: NEGATIVE
Hep A IgM: NONREACTIVE
Hep B C IgM: NONREACTIVE
Hepatitis B Surface Ag: NEGATIVE

## 2014-09-08 ENCOUNTER — Encounter: Payer: Self-pay | Admitting: Gynecology

## 2014-11-03 ENCOUNTER — Ambulatory Visit (INDEPENDENT_AMBULATORY_CARE_PROVIDER_SITE_OTHER): Payer: Managed Care, Other (non HMO) | Admitting: Gynecology

## 2014-11-03 ENCOUNTER — Encounter: Payer: Self-pay | Admitting: Gynecology

## 2014-11-03 VITALS — BP 130/82 | Ht 66.25 in | Wt 219.0 lb

## 2014-11-03 DIAGNOSIS — N9089 Other specified noninflammatory disorders of vulva and perineum: Secondary | ICD-10-CM

## 2014-11-03 DIAGNOSIS — Z01419 Encounter for gynecological examination (general) (routine) without abnormal findings: Secondary | ICD-10-CM

## 2014-11-03 DIAGNOSIS — R102 Pelvic and perineal pain: Secondary | ICD-10-CM

## 2014-11-03 DIAGNOSIS — J209 Acute bronchitis, unspecified: Secondary | ICD-10-CM

## 2014-11-03 MED ORDER — BENZONATATE 200 MG PO CAPS: 200.0000 mg | ORAL_CAPSULE | Freq: Three times a day (TID) | ORAL | Status: DC | PRN

## 2014-11-03 NOTE — Addendum Note (Signed)
Addended by: [name], Marinell Igarashi P on: 11/03/2014 11:42 AM   Modules accepted: Orders

## 2014-11-03 NOTE — Progress Notes (Signed)
Regina FraterSara J Clark 06/20/1976 [id number]        38 y.o.  G3P3003 for annual exam.  Several issues noted below.  Past medical history,surgical history, problem list, medications, allergies, family history and social history were all reviewed and documented as reviewed in the EPIC chart.  ROS:  Performed with pertinent positives and negatives included in the history, assessment and plan.   Additional significant findings :  Cough, nonproductive over the last 4-5 days. Other family members sick. No fever or chills, SOB   Exam: Charity fundraiserBlanca assistant Filed Vitals:   11/03/14 1058  BP: 130/82  Height: 5' 6.25" (1.683 m)  Weight: 219 lb (99.338 kg)   General appearance:  Normal affect, orientation and appearance. Skin: Grossly normal HEENT: Without gross lesions.  No cervical or supraclavicular adenopathy. Thyroid normal.  Lungs:  Clear without wheezing, rales or rhonchi Cardiac: RR, without RMG Abdominal:  Soft, nontender, without masses, guarding, rebound, organomegaly or hernia Breasts:  Examined lying and sitting without masses, retractions, discharge or axillary adenopathy. Pelvic:  Ext/BUS/vagina with 2 small pedunculated skin tags as diagrammed below. Physical Exam  Genitourinary:        Cervix normal  Uterus anteverted, normal size, shape and contour, midline and mobile nontender   Adnexa  Without masses or tenderness    Anus and perineum  Normal   Rectovaginal  Normal sphincter tone without palpated masses or tenderness.    Assessment/Plan:  38 y.o. 663P3003 female for annual exam with regular menses, tubal sterilization.   1. Skin tags as diagrammed above. Patient wants excised. Both areas were cleansed with Betadine, infiltrated with 1% lidocaine and both skin tags excised in their entirety to the level of the surrounding skin. Silver nitrate hemostasis applied. Both specimens sent to pathology. Patient will follow up for results. 2. Bronchitis type cough. Lungs are clear. Using  OTC mucolytic. Tessalon pearls 200 mg #20 provided to help with the cough. Push fluids and follow up if it continues. No fever or chills shortness of breath or other symptoms to suggest lung involvement. 3. Left lower quadrant discomfort on and off since delivery of her last child. No diarrhea constipation nausea vomiting or other symptoms. Recent colonoscopy 2015 negative. Exam is normal. Start with ultrasound to rule out nonpalpable abnormalities. 4. Pap smear 2013. No Pap smear done today. Plan repeat Pap smear next year at three-year interval. No history of abnormal Pap smears previously. 5. Breast health. SBE monthly reviewed.  No history of breast cancer in the family. Plan screening mammography closer to 40. 6. Health maintenance. Due to strong family history of colon cancer patient had colonoscopy this past year which was negative. Also history of mildly elevated LFTs and elevated cholesterol. Has had follow up with her primary physician this past year and she reports having recent blood work done this past November to follow up on these. No blood work done today. Follow up for ultrasound and biopsy results.     [name],[name] P MD, 11:31 AM 11/03/2014

## 2014-11-03 NOTE — Patient Instructions (Signed)
Office will call you with biopsy results. Schedule the ultrasound and follow up for this.  You may obtain a copy of any labs that were done today by logging onto MyChart as outlined in the instructions provided with your AVS (after visit summary). The office will not call with normal lab results but certainly if there are any significant abnormalities then we will contact you.   Health Maintenance, Female A healthy lifestyle and preventative care can promote health and wellness.  Maintain regular health, dental, and eye exams.  Eat a healthy diet. Foods like vegetables, fruits, whole grains, low-fat dairy products, and lean protein foods contain the nutrients you need without too many calories. Decrease your intake of foods high in solid fats, added sugars, and salt. Get information about a proper diet from your caregiver, if necessary.  Regular physical exercise is one of the most important things you can do for your health. Most adults should get at least 150 minutes of moderate-intensity exercise (any activity that increases your heart rate and causes you to sweat) each week. In addition, most adults need muscle-strengthening exercises on 2 or more days a week.   Maintain a healthy weight. The body mass index (BMI) is a screening tool to identify possible weight problems. It provides an estimate of body fat based on height and weight. Your caregiver can help determine your BMI, and can help you achieve or maintain a healthy weight. For adults 20 years and older:  A BMI below 18.5 is considered underweight.  A BMI of 18.5 to 24.9 is normal.  A BMI of 25 to 29.9 is considered overweight.  A BMI of 30 and above is considered obese.  Maintain normal blood lipids and cholesterol by exercising and minimizing your intake of saturated fat. Eat a balanced diet with plenty of fruits and vegetables. Blood tests for lipids and cholesterol should begin at age 2 and be repeated every 5 years. If your  lipid or cholesterol levels are high, you are over 50, or you are a high risk for heart disease, you may need your cholesterol levels checked more frequently.Ongoing high lipid and cholesterol levels should be treated with medicines if diet and exercise are not effective.  If you smoke, find out from your caregiver how to quit. If you do not use tobacco, do not start.  Lung cancer screening is recommended for adults aged 63 80 years who are at high risk for developing lung cancer because of a history of smoking. Yearly low-dose computed tomography (CT) is recommended for people who have at least a 30-pack-year history of smoking and are a current smoker or have quit within the past 15 years. A pack year of smoking is smoking an average of 1 pack of cigarettes a day for 1 year (for example: 1 pack a day for 30 years or 2 packs a day for 15 years). Yearly screening should continue until the smoker has stopped smoking for at least 15 years. Yearly screening should also be stopped for people who develop a health problem that would prevent them from having lung cancer treatment.  If you are pregnant, do not drink alcohol. If you are breastfeeding, be very cautious about drinking alcohol. If you are not pregnant and choose to drink alcohol, do not exceed 1 drink per day. One drink is considered to be 12 ounces (355 mL) of beer, 5 ounces (148 mL) of wine, or 1.5 ounces (44 mL) of liquor.  Avoid use of street drugs. Do  not share needles with anyone. Ask for help if you need support or instructions about stopping the use of drugs.  High blood pressure causes heart disease and increases the risk of stroke. Blood pressure should be checked at least every 1 to 2 years. Ongoing high blood pressure should be treated with medicines, if weight loss and exercise are not effective.  If you are 62 to 38 years old, ask your caregiver if you should take aspirin to prevent strokes.  Diabetes screening involves taking a  blood sample to check your fasting blood sugar level. This should be done once every 3 years, after age 68, if you are within normal weight and without risk factors for diabetes. Testing should be considered at a younger age or be carried out more frequently if you are overweight and have at least 1 risk factor for diabetes.  Breast cancer screening is essential preventative care for women. You should practice "breast self-awareness." This means understanding the normal appearance and feel of your breasts and may include breast self-examination. Any changes detected, no matter how small, should be reported to a caregiver. Women in their 79s and 30s should have a clinical breast exam (CBE) by a caregiver as part of a regular health exam every 1 to 3 years. After age 32, women should have a CBE every year. Starting at age 78, women should consider having a mammogram (breast X-ray) every year. Women who have a family history of breast cancer should talk to their caregiver about genetic screening. Women at a high risk of breast cancer should talk to their caregiver about having an MRI and a mammogram every year.  Breast cancer gene (BRCA)-related cancer risk assessment is recommended for women who have family members with BRCA-related cancers. BRCA-related cancers include breast, ovarian, tubal, and peritoneal cancers. Having family members with these cancers may be associated with an increased risk for harmful changes (mutations) in the breast cancer genes BRCA1 and BRCA2. Results of the assessment will determine the need for genetic counseling and BRCA1 and BRCA2 testing.  The Pap test is a screening test for cervical cancer. Women should have a Pap test starting at age 63. Between ages 17 and 63, Pap tests should be repeated every 2 years. Beginning at age 65, you should have a Pap test every 3 years as long as the past 3 Pap tests have been normal. If you had a hysterectomy for a problem that was not cancer or  a condition that could lead to cancer, then you no longer need Pap tests. If you are between ages 61 and 33, and you have had normal Pap tests going back 10 years, you no longer need Pap tests. If you have had past treatment for cervical cancer or a condition that could lead to cancer, you need Pap tests and screening for cancer for at least 20 years after your treatment. If Pap tests have been discontinued, risk factors (such as a new sexual partner) need to be reassessed to determine if screening should be resumed. Some women have medical problems that increase the chance of getting cervical cancer. In these cases, your caregiver may recommend more frequent screening and Pap tests.  The human papillomavirus (HPV) test is an additional test that may be used for cervical cancer screening. The HPV test looks for the virus that can cause the cell changes on the cervix. The cells collected during the Pap test can be tested for HPV. The HPV test could be used to  screen women aged 23 years and older, and should be used in women of any age who have unclear Pap test results. After the age of 54, women should have HPV testing at the same frequency as a Pap test.  Colorectal cancer can be detected and often prevented. Most routine colorectal cancer screening begins at the age of 70 and continues through age 42. However, your caregiver may recommend screening at an earlier age if you have risk factors for colon cancer. On a yearly basis, your caregiver may provide home test kits to check for hidden blood in the stool. Use of a small camera at the end of a tube, to directly examine the colon (sigmoidoscopy or colonoscopy), can detect the earliest forms of colorectal cancer. Talk to your caregiver about this at age 41, when routine screening begins. Direct examination of the colon should be repeated every 5 to 10 years through age 51, unless early forms of pre-cancerous polyps or small growths are found.  Hepatitis C  blood testing is recommended for all people born from [birth year] through 1965 and any individual with known risks for hepatitis C.  Practice safe sex. Use condoms and avoid high-risk sexual practices to reduce the spread of sexually transmitted infections (STIs). Sexually active women aged 84 and younger should be checked for Chlamydia, which is a common sexually transmitted infection. Older women with new or multiple partners should also be tested for Chlamydia. Testing for other STIs is recommended if you are sexually active and at increased risk.  Osteoporosis is a disease in which the bones lose minerals and strength with aging. This can result in serious bone fractures. The risk of osteoporosis can be identified using a bone density scan. Women ages 63 and over and women at risk for fractures or osteoporosis should discuss screening with their caregivers. Ask your caregiver whether you should be taking a calcium supplement or vitamin D to reduce the rate of osteoporosis.  Menopause can be associated with physical symptoms and risks. Hormone replacement therapy is available to decrease symptoms and risks. You should talk to your caregiver about whether hormone replacement therapy is right for you.  Use sunscreen. Apply sunscreen liberally and repeatedly throughout the day. You should seek shade when your shadow is shorter than you. Protect yourself by wearing long sleeves, pants, a wide-brimmed hat, and sunglasses year round, whenever you are outdoors.  Notify your caregiver of new moles or changes in moles, especially if there is a change in shape or color. Also notify your caregiver if a mole is larger than the size of a pencil eraser.  Stay current with your immunizations. Document Released: 05/09/2011 Document Revised: 02/18/2013 Document Reviewed: 05/09/2011 [facility] Patient Information 2014 [place].

## 2014-11-04 LAB — URINALYSIS W MICROSCOPIC + REFLEX CULTURE
Bilirubin Urine: NEGATIVE
CRYSTALS: NONE SEEN
Casts: NONE SEEN
GLUCOSE, UA: NEGATIVE mg/dL
HGB URINE DIPSTICK: NEGATIVE
KETONES UR: NEGATIVE mg/dL
Leukocytes, UA: NEGATIVE
Nitrite: NEGATIVE
PH: 5 (ref 5.0–8.0)
Protein, ur: NEGATIVE mg/dL
Specific Gravity, Urine: 1.022 (ref 1.005–1.030)
Urobilinogen, UA: 0.2 mg/dL (ref 0.0–1.0)

## 2014-11-05 LAB — URINE CULTURE: Colony Count: 25000

## 2014-11-24 ENCOUNTER — Ambulatory Visit (INDEPENDENT_AMBULATORY_CARE_PROVIDER_SITE_OTHER): Payer: Managed Care, Other (non HMO) | Admitting: Gynecology

## 2014-11-24 ENCOUNTER — Other Ambulatory Visit: Payer: Self-pay | Admitting: Gynecology

## 2014-11-24 ENCOUNTER — Ambulatory Visit (INDEPENDENT_AMBULATORY_CARE_PROVIDER_SITE_OTHER): Payer: Managed Care, Other (non HMO)

## 2014-11-24 ENCOUNTER — Encounter: Payer: Self-pay | Admitting: Gynecology

## 2014-11-24 DIAGNOSIS — N831 Corpus luteum cyst of ovary, unspecified side: Secondary | ICD-10-CM

## 2014-11-24 DIAGNOSIS — N83202 Unspecified ovarian cyst, left side: Secondary | ICD-10-CM

## 2014-11-24 DIAGNOSIS — R102 Pelvic and perineal pain: Secondary | ICD-10-CM

## 2014-11-24 DIAGNOSIS — N838 Other noninflammatory disorders of ovary, fallopian tube and broad ligament: Secondary | ICD-10-CM

## 2014-11-24 DIAGNOSIS — N839 Noninflammatory disorder of ovary, fallopian tube and broad ligament, unspecified: Secondary | ICD-10-CM

## 2014-11-24 DIAGNOSIS — N8320 Unspecified ovarian cysts: Secondary | ICD-10-CM

## 2014-11-24 NOTE — Patient Instructions (Signed)
Follow up for ultrasound in 2-3 months 

## 2014-11-24 NOTE — Progress Notes (Addendum)
Willa FraterSara J Falletta 1976/07/17 [id number]        39 y.o.  G3P3003 presents for ultrasound. History of intermittent pelvic pain left greater than right usually following intercourse. Exam was normal.  Started after her last cesarean section several years ago. Does have a history of colitis.  Past medical history,surgical history, problem list, medications, allergies, family history and social history were all reviewed and documented in the EPIC chart.  Directed ROS with pertinent positives and negatives documented in the history of present illness/assessment and plan.  Ultrasound shows uterus overall normal size and echotexture. Endometrial echo 10.6 mm noting LMP 11/09/2014. Right ovary normal with corpus luteal cyst. Left ovary with solid appearing area 20 x 19 x 19 mm with arterial flow. Cul-de-sac negative.  Assessment/Plan:  39 y.o. G3P3003 fleeting intermittent pelvic pain left primarily usually following intercourse. Current now for several years. Ultrasound his overall normal excepting solid area 20 mm left ovary. Differential to include tumor versus physiologic such as hemorrhagic cyst or possible endometrioma reviewed.  Options to include laparoscopy now both for the pain and ovarian area versus monitoring with follow up ultrasound several months discussed. At this point patient prefers observation with follow up ultrasound in 2-3 months. Will discuss situation with her husband and follow up if she wants to proceed with laparoscopy sooner.     [name],Kinberly Perris P MD, 11:24 AM 11/24/2014

## 2014-12-03 ENCOUNTER — Telehealth: Payer: Self-pay

## 2014-12-03 NOTE — Telephone Encounter (Signed)
Patient has decided she would like to go ahead and proceed with laparoscopy now.

## 2014-12-03 NOTE — Telephone Encounter (Signed)
I will send you surgery slip to schedule

## 2014-12-04 ENCOUNTER — Telehealth: Payer: Self-pay

## 2014-12-04 NOTE — Telephone Encounter (Signed)
I spoke with patient and review ins benefits and financial responsibility for surgery.  I scheduled her Dx Lap for 12/30/14 at 10:45am. She will see Dr. Velvet BatheF on 12/23/14 at 1:30pm for pre op consult.  [facility].

## 2014-12-08 DIAGNOSIS — R7989 Other specified abnormal findings of blood chemistry: Secondary | ICD-10-CM

## 2014-12-08 HISTORY — DX: Other specified abnormal findings of blood chemistry: R79.89

## 2014-12-15 NOTE — Patient Instructions (Addendum)
   Your procedure is scheduled on: Tuesday, Feb 23 [facility] at: 9:30 AM Pick up the phone at the desk and dial [phone] and inform us of your arrival.  Please call this number if you have any problems the morning of surgery: [phone]  Remember: Do not eat or drink after midnight: Monday Take these medicines the morning of surgery with a SIP OF WATER:  Nexium  Do not wear jewelry, make-up, or FINGER nail polish No metal in your hair or on your body. Do not wear lotions, powders, perfumes.  You may wear deodorant.  Do not bring valuables to the hospital. Contacts, dentures or bridgework may not be worn into surgery.  Patients discharged on the day of surgery will not be allowed to drive home.  Home with husband Scott cell [phone].

## 2014-12-16 ENCOUNTER — Encounter (HOSPITAL_COMMUNITY): Payer: Self-pay

## 2014-12-16 ENCOUNTER — Encounter (HOSPITAL_COMMUNITY)
Admission: RE | Admit: 2014-12-16 | Discharge: 2014-12-16 | Disposition: A | Discharge status: Home / Self Care | Payer: Managed Care, Other (non HMO) | Source: Ambulatory Visit | Attending: Gynecology | Admitting: Gynecology

## 2014-12-16 DIAGNOSIS — Z01812 Encounter for preprocedural laboratory examination: Secondary | ICD-10-CM | POA: Insufficient documentation

## 2014-12-16 DIAGNOSIS — N8320 Unspecified ovarian cysts: Secondary | ICD-10-CM | POA: Insufficient documentation

## 2014-12-16 HISTORY — DX: Gastro-esophageal reflux disease without esophagitis: K21.9

## 2014-12-16 LAB — CBC
HEMATOCRIT: 40.1 % (ref 36.0–46.0)
Hemoglobin: 14 g/dL (ref 12.0–15.0)
MCH: 31.8 pg (ref 26.0–34.0)
MCHC: 34.9 g/dL (ref 30.0–36.0)
MCV: 91.1 fL (ref 78.0–100.0)
PLATELETS: 193 10*3/uL (ref 150–400)
RBC: 4.4 MIL/uL (ref 3.87–5.11)
RDW: 12.5 % (ref 11.5–15.5)
WBC: 6.9 10*3/uL (ref 4.0–10.5)

## 2014-12-16 LAB — COMPREHENSIVE METABOLIC PANEL
ALK PHOS: 67 U/L (ref 39–117)
ALT: 39 U/L — AB (ref 0–35)
AST: 40 U/L — AB (ref 0–37)
Albumin: 4.1 g/dL (ref 3.5–5.2)
Anion gap: 7 (ref 5–15)
BILIRUBIN TOTAL: 1.8 mg/dL — AB (ref 0.3–1.2)
BUN: 7 mg/dL (ref 6–23)
CHLORIDE: 105 mmol/L (ref 96–112)
CO2: 25 mmol/L (ref 19–32)
Calcium: 8.7 mg/dL (ref 8.4–10.5)
Creatinine, Ser: 0.7 mg/dL (ref 0.50–1.10)
GFR calc Af Amer: >90 mL/min (ref >90)
GFR calc non Af Amer: >90 mL/min (ref >90)
Glucose, Bld: 102 mg/dL — ABNORMAL HIGH (ref 70–99)
Potassium: 3.7 mmol/L (ref 3.5–5.1)
Sodium: 137 mmol/L (ref 135–145)
Total Protein: 7.4 g/dL (ref 6.0–8.3)

## 2014-12-23 ENCOUNTER — Encounter: Payer: Self-pay | Admitting: Gynecology

## 2014-12-23 ENCOUNTER — Ambulatory Visit (INDEPENDENT_AMBULATORY_CARE_PROVIDER_SITE_OTHER): Payer: Managed Care, Other (non HMO) | Admitting: Gynecology

## 2014-12-23 VITALS — BP 120/76

## 2014-12-23 DIAGNOSIS — R102 Pelvic and perineal pain: Secondary | ICD-10-CM

## 2014-12-23 DIAGNOSIS — N8320 Unspecified ovarian cysts: Secondary | ICD-10-CM

## 2014-12-23 DIAGNOSIS — N83202 Unspecified ovarian cyst, left side: Secondary | ICD-10-CM

## 2014-12-23 NOTE — Progress Notes (Signed)
Regina FraterSara J Clark [birth year] [id number]   Preoperative consult  Chief complaint: pelvic pain, dyspareunia, left ovarian cystic mass  History of present illness: 39 y.o. G3P3003 with history of pelvic pain, left greater than right although can occur bilaterally. Frequently follows intercourse. Onset seems to have started after her last cesarean section several years ago. Seems to slowly be getting worse. Had recent ultrasound which showed a solid vascular area left ovary with microcystic pattern questionable endometrioma 20 mm in size. Options for expectant management with repeat ultrasound in several months versus laparoscopy reviewed. Patient wants to proceed with laparoscopy.  Status post tubal sterilization in the past.  Past medical history,surgical history, medications, allergies, family history and social history were all reviewed and documented in the EPIC chart.  ROS:  Was performed and pertinent positives and negatives are included in the history of present illness.  Exam:  Kim assistant General: well developed, well nourished female, no acute distress HEENT: normal  Lungs: clear to auscultation without wheezing, rales or rhonchi  Cardiac: regular rate without rubs, murmurs or gallops  Abdomen: soft, nontender without masses, guarding, rebound, organomegaly  Pelvic: external bus vagina: normal   Cervix: grossly normal  Uterus: normal size, midline and mobile, nontender  Adnexa: without masses or tenderness    Assessment/Plan:  39 y.o. [name] with the above history. For laparoscopic assessment possible resection of left ovarian cyst and treatment of pathology encountered. Reviewed possibilities with her to include a negative laparoscopy, endometriosis, adhesions or other pathology. Understands that I will try to address whatever pathology encountered but may not be able to do so due to the extent or location. Expected intraoperative and postoperative courses as well as the recovery  period was reviewed with her. The risks to include infection, prolonged antibiotics, hemorrhage necessitating transfusion and the risks of transfusion including transfusion reaction, hepatitis, HIV, mad cow disease and other unknown entities. Incisional complications requiring opening and draining of incisions, closure by secondary intention and long-term issues to include keloid/cosmetics and hernia formation. Risks of inadvertent injury to internal organs including bowel, bladder, ureters, vessels, nerves, either immediately recognized or delay recognized, necessitating major exploratory reparative surgeries and future reparative surgeries including ostomy formation, bowel resection, bladder repair, ureteral damage repair. No guarantees as far as pain relief were made and that her pain may persist, worsen or change following the procedure. Patient also understands removal of her left ovary along with the cystic area may be necessary due to extent of involvement or complications such as bleeding requiring removal of the ovary and she gives me permission to do so. Patient's questions were answered to her satisfaction she is ready to proceed with surgery.    [name],Myangel Summons P MD, 1:53 PM 12/23/2014

## 2014-12-23 NOTE — Patient Instructions (Signed)
Followup for surgery as scheduled. 

## 2014-12-23 NOTE — H&P (Signed)
  Regina Clark [birth year] [id number]   History and Physical  Chief complaint: pelvic pain, dyspareunia, left ovarian cystic mass  History of present illness: 39 y.o. G3P3003 with history of pelvic pain, left greater than right although can occur bilaterally. Frequently follows intercourse. Onset seems to have started after her last cesarean section several years ago. Seems to slowly be getting worse. Had recent ultrasound which showed a solid vascular area left ovary with microcystic pattern questionable endometrioma 20 mm in size. Options for expectant management with repeat ultrasound in several months versus laparoscopy reviewed. Patient wants to proceed with laparoscopy.  Status post tubal sterilization in the past.  Past medical history,surgical history, medications, allergies, family history and social history were all reviewed and documented in the EPIC chart.  ROS:  Was performed and pertinent positives and negatives are included in the history of present illness.  Exam:  Kim assistant 12/23/2014 General: well developed, well nourished female, no acute distress HEENT: normal  Lungs: clear to auscultation without wheezing, rales or rhonchi  Cardiac: regular rate without rubs, murmurs or gallops  Abdomen: soft, nontender without masses, guarding, rebound, organomegaly  Pelvic: external bus vagina: normal   Cervix: grossly normal  Uterus: normal size, midline and mobile, nontender  Adnexa: without masses or tenderness    Assessment/Plan:  39 y.o. [name] with the above history. For laparoscopic assessment possible resection of left ovarian cyst and treatment of pathology encountered. Reviewed possibilities with her to include a negative laparoscopy, endometriosis, adhesions or other pathology. Understands that I will try to address whatever pathology encountered but may not be able to do so due to the extent or location. Expected intraoperative and postoperative courses as well as the  recovery period was reviewed with her. The risks to include infection, prolonged antibiotics, hemorrhage necessitating transfusion and the risks of transfusion including transfusion reaction, hepatitis, HIV, mad cow disease and other unknown entities. Incisional complications requiring opening and draining of incisions, closure by secondary intention and long-term issues to include keloid/cosmetics and hernia formation. Risks of inadvertent injury to internal organs including bowel, bladder, ureters, vessels, nerves, either immediately recognized or delay recognized, necessitating major exploratory reparative surgeries and future reparative surgeries including ostomy formation, bowel resection, bladder repair, ureteral damage repair. No guarantees as far as pain relief were made and that her pain may persist, worsen or change following the procedure. Patient also understands removal of her left ovary along with the cystic area may be necessary due to extent of involvement or complications such as bleeding requiring removal of the ovary and she gives me permission to do so. Patient's questions were answered to her satisfaction she is ready to proceed with surgery.    [name],Samba Cumba P MD, 2:17 PM 12/23/2014

## 2014-12-29 MED ORDER — METRONIDAZOLE IN NACL 5-0.79 MG/ML-% IV SOLN
500.0000 mg | INTRAVENOUS | Status: AC
  Administered 2014-12-30: 500 mg via INTRAVENOUS
  Filled 2014-12-29: qty 100

## 2014-12-29 MED ORDER — CIPROFLOXACIN IN D5W 400 MG/200ML IV SOLN
400.0000 mg | INTRAVENOUS | Status: AC
  Administered 2014-12-30: 400 mg via INTRAVENOUS
  Filled 2014-12-29: qty 200

## 2014-12-29 NOTE — Anesthesia Preprocedure Evaluation (Signed)
Anesthesia Evaluation  Patient identified by MRN, date of birth, ID band Patient awake    Reviewed: Allergy & Precautions, NPO status , Patient's Chart, lab work & pertinent test results  History of Anesthesia Complications Negative for: history of anesthetic complications  Airway Mallampati: II  TM Distance: >3 FB Neck ROM: Full    Dental no notable dental hx. (+) Dental Advisory Given   Pulmonary former smoker,  breath sounds clear to auscultation  Pulmonary exam normal       Cardiovascular negative cardio ROS  Rhythm:Regular Rate:Normal     Neuro/Psych negative neurological ROS  negative psych ROS   GI/Hepatic Neg liver ROS, GERD-  Medicated and Controlled,  Endo/Other  obesity  Renal/GU negative Renal ROS  negative genitourinary   Musculoskeletal negative musculoskeletal ROS (+)   Abdominal   Peds negative pediatric ROS (+)  Hematology negative hematology ROS (+)   Anesthesia Other Findings   Reproductive/Obstetrics negative OB ROS                             Anesthesia Physical Anesthesia Plan  ASA: II  Anesthesia Plan: General   Post-op Pain Management:    Induction: Intravenous  Airway Management Planned: Oral ETT  Additional Equipment:   Intra-op Plan:   Post-operative Plan: Extubation in OR  Informed Consent: I have reviewed the patients History and Physical, chart, labs and discussed the procedure including the risks, benefits and alternatives for the proposed anesthesia with the patient or authorized representative who has indicated his/her understanding and acceptance.   Dental advisory given  Plan Discussed with: CRNA  Anesthesia Plan Comments:         Anesthesia Quick Evaluation

## 2014-12-30 ENCOUNTER — Ambulatory Visit (HOSPITAL_COMMUNITY): Payer: Managed Care, Other (non HMO) | Admitting: Anesthesiology

## 2014-12-30 ENCOUNTER — Ambulatory Visit (HOSPITAL_COMMUNITY)
Admission: RE | Admit: 2014-12-30 | Discharge: 2014-12-30 | Disposition: A | Discharge status: Home / Self Care | Payer: Managed Care, Other (non HMO) | Source: Ambulatory Visit | Attending: Gynecology | Admitting: Gynecology

## 2014-12-30 ENCOUNTER — Encounter (HOSPITAL_COMMUNITY)
Admission: RE | Disposition: A | Discharge status: Home / Self Care | Payer: Self-pay | Source: Ambulatory Visit | Attending: Gynecology

## 2014-12-30 ENCOUNTER — Encounter (HOSPITAL_COMMUNITY): Payer: Self-pay | Admitting: *Deleted

## 2014-12-30 DIAGNOSIS — K219 Gastro-esophageal reflux disease without esophagitis: Secondary | ICD-10-CM | POA: Diagnosis not present

## 2014-12-30 DIAGNOSIS — R102 Pelvic and perineal pain: Secondary | ICD-10-CM

## 2014-12-30 DIAGNOSIS — Z87891 Personal history of nicotine dependence: Secondary | ICD-10-CM | POA: Insufficient documentation

## 2014-12-30 DIAGNOSIS — N831 Corpus luteum cyst: Secondary | ICD-10-CM | POA: Insufficient documentation

## 2014-12-30 DIAGNOSIS — N941 Dyspareunia: Secondary | ICD-10-CM

## 2014-12-30 DIAGNOSIS — N736 Female pelvic peritoneal adhesions (postinfective): Secondary | ICD-10-CM | POA: Diagnosis not present

## 2014-12-30 DIAGNOSIS — N949 Unspecified condition associated with female genital organs and menstrual cycle: Secondary | ICD-10-CM | POA: Diagnosis present

## 2014-12-30 DIAGNOSIS — N8320 Unspecified ovarian cysts: Secondary | ICD-10-CM

## 2014-12-30 DIAGNOSIS — N809 Endometriosis, unspecified: Secondary | ICD-10-CM

## 2014-12-30 HISTORY — PX: LAPAROSCOPIC OVARIAN CYSTECTOMY: SHX6248

## 2014-12-30 HISTORY — DX: Abnormal results of liver function studies: R94.5

## 2014-12-30 HISTORY — PX: LAPAROSCOPY: SHX197

## 2014-12-30 HISTORY — PX: LAPAROSCOPIC LYSIS OF ADHESIONS: SHX5905

## 2014-12-30 LAB — PREGNANCY, URINE: Preg Test, Ur: NEGATIVE

## 2014-12-30 SURGERY — LAPAROSCOPY, DIAGNOSTIC
Anesthesia: General | Site: Abdomen

## 2014-12-30 MED ORDER — FENTANYL CITRATE 0.05 MG/ML IJ SOLN
INTRAMUSCULAR | Status: AC
  Filled 2014-12-30: qty 2

## 2014-12-30 MED ORDER — DEXAMETHASONE SODIUM PHOSPHATE 4 MG/ML IJ SOLN
INTRAMUSCULAR | Status: AC
  Filled 2014-12-30: qty 1

## 2014-12-30 MED ORDER — ONDANSETRON HCL 4 MG/2ML IJ SOLN
INTRAMUSCULAR | Status: AC
  Filled 2014-12-30: qty 2

## 2014-12-30 MED ORDER — ROCURONIUM BROMIDE 100 MG/10ML IV SOLN
INTRAVENOUS | Status: AC
  Filled 2014-12-30: qty 1

## 2014-12-30 MED ORDER — BUPIVACAINE HCL (PF) 0.25 % IJ SOLN
INTRAMUSCULAR | Status: AC
  Filled 2014-12-30: qty 30

## 2014-12-30 MED ORDER — PROPOFOL 10 MG/ML IV BOLUS
INTRAVENOUS | Status: AC
Start: 2014-12-30 — End: 2014-12-30
  Filled 2014-12-30: qty 20

## 2014-12-30 MED ORDER — FENTANYL CITRATE 0.05 MG/ML IJ SOLN
INTRAMUSCULAR | Status: DC | PRN
  Administered 2014-12-30 (×3): 50 ug via INTRAVENOUS
  Administered 2014-12-30: 100 ug via INTRAVENOUS

## 2014-12-30 MED ORDER — NEOSTIGMINE METHYLSULFATE 10 MG/10ML IV SOLN
INTRAVENOUS | Status: DC | PRN
  Administered 2014-12-30: 2.5 mg via INTRAVENOUS

## 2014-12-30 MED ORDER — LACTATED RINGERS IV SOLN
INTRAVENOUS | Status: DC
  Administered 2014-12-30 (×2): via INTRAVENOUS

## 2014-12-30 MED ORDER — ONDANSETRON HCL 4 MG/2ML IJ SOLN
INTRAMUSCULAR | Status: DC | PRN
  Administered 2014-12-30: 4 mg via INTRAVENOUS

## 2014-12-30 MED ORDER — KETOROLAC TROMETHAMINE 30 MG/ML IJ SOLN
INTRAMUSCULAR | Status: AC
  Filled 2014-12-30: qty 1

## 2014-12-30 MED ORDER — GLYCOPYRROLATE 0.2 MG/ML IJ SOLN
INTRAMUSCULAR | Status: DC | PRN
  Administered 2014-12-30: 0.4 mg via INTRAVENOUS

## 2014-12-30 MED ORDER — SUCCINYLCHOLINE CHLORIDE 20 MG/ML IJ SOLN
INTRAMUSCULAR | Status: DC | PRN
  Administered 2014-12-30: 120 mg via INTRAVENOUS

## 2014-12-30 MED ORDER — GLYCOPYRROLATE 0.2 MG/ML IJ SOLN
INTRAMUSCULAR | Status: AC
  Filled 2014-12-30: qty 4

## 2014-12-30 MED ORDER — SCOPOLAMINE 1 MG/3DAYS TD PT72
1.0000 | MEDICATED_PATCH | Freq: Once | TRANSDERMAL | Status: DC
  Administered 2014-12-30: 1.5 mg via TRANSDERMAL

## 2014-12-30 MED ORDER — 0.9 % SODIUM CHLORIDE (POUR BTL) OPTIME
TOPICAL | Status: DC | PRN
  Administered 2014-12-30: 1000 mL

## 2014-12-30 MED ORDER — PHENYLEPHRINE HCL 10 MG/ML IJ SOLN
INTRAMUSCULAR | Status: DC | PRN
  Administered 2014-12-30: 80 ug via INTRAVENOUS

## 2014-12-30 MED ORDER — MIDAZOLAM HCL 2 MG/2ML IJ SOLN
INTRAMUSCULAR | Status: DC | PRN
  Administered 2014-12-30: 2 mg via INTRAVENOUS

## 2014-12-30 MED ORDER — DEXAMETHASONE SODIUM PHOSPHATE 10 MG/ML IJ SOLN
INTRAMUSCULAR | Status: DC | PRN
  Administered 2014-12-30: 4 mg via INTRAVENOUS

## 2014-12-30 MED ORDER — LACTATED RINGERS IR SOLN
Status: DC | PRN
  Administered 2014-12-30: 3000 mL

## 2014-12-30 MED ORDER — FENTANYL CITRATE 0.05 MG/ML IJ SOLN
25.0000 ug | INTRAMUSCULAR | Status: DC | PRN
  Administered 2014-12-30 (×2): 50 ug via INTRAVENOUS

## 2014-12-30 MED ORDER — ONDANSETRON HCL 4 MG/2ML IJ SOLN: 4.0000 mg | Freq: Once | INTRAMUSCULAR | Status: DC | PRN

## 2014-12-30 MED ORDER — LIDOCAINE HCL (CARDIAC) 20 MG/ML IV SOLN
INTRAVENOUS | Status: DC | PRN
  Administered 2014-12-30: 80 mg via INTRAVENOUS

## 2014-12-30 MED ORDER — LIDOCAINE HCL (CARDIAC) 20 MG/ML IV SOLN
INTRAVENOUS | Status: AC
  Filled 2014-12-30: qty 5

## 2014-12-30 MED ORDER — ROCURONIUM BROMIDE 100 MG/10ML IV SOLN
INTRAVENOUS | Status: DC | PRN
  Administered 2014-12-30: 20 mg via INTRAVENOUS
  Administered 2014-12-30: 5 mg via INTRAVENOUS

## 2014-12-30 MED ORDER — OXYCODONE-ACETAMINOPHEN 5-325 MG PO TABS: 1.0000 | ORAL_TABLET | ORAL | Status: DC | PRN

## 2014-12-30 MED ORDER — MIDAZOLAM HCL 2 MG/2ML IJ SOLN
INTRAMUSCULAR | Status: AC
  Filled 2014-12-30: qty 2

## 2014-12-30 MED ORDER — NEOSTIGMINE METHYLSULFATE 10 MG/10ML IV SOLN
INTRAVENOUS | Status: AC
  Filled 2014-12-30: qty 1

## 2014-12-30 MED ORDER — BUPIVACAINE HCL (PF) 0.25 % IJ SOLN
INTRAMUSCULAR | Status: DC | PRN
  Administered 2014-12-30: 10 mL

## 2014-12-30 MED ORDER — PHENYLEPHRINE 40 MCG/ML (10ML) SYRINGE FOR IV PUSH (FOR BLOOD PRESSURE SUPPORT)
PREFILLED_SYRINGE | INTRAVENOUS | Status: AC
  Filled 2014-12-30: qty 10

## 2014-12-30 MED ORDER — SCOPOLAMINE 1 MG/3DAYS TD PT72
MEDICATED_PATCH | TRANSDERMAL | Status: AC
  Filled 2014-12-30: qty 1

## 2014-12-30 MED ORDER — PROPOFOL 10 MG/ML IV BOLUS
INTRAVENOUS | Status: DC | PRN
  Administered 2014-12-30: 200 mg via INTRAVENOUS

## 2014-12-30 MED ORDER — FENTANYL CITRATE 0.05 MG/ML IJ SOLN
INTRAMUSCULAR | Status: AC
  Filled 2014-12-30: qty 5

## 2014-12-30 SURGICAL SUPPLY — 39 items
BARRIER ADHS 3X4 INTERCEED (GAUZE/BANDAGES/DRESSINGS) IMPLANT
BLADE SURG 15 STRL LF C SS BP (BLADE) ×2 IMPLANT
BLADE SURG 15 STRL SS (BLADE) ×2
CABLE HIGH FREQUENCY MONO STRZ (ELECTRODE) IMPLANT
CATH ROBINSON RED A/P 16FR (CATHETERS) ×4 IMPLANT
CLOTH BEACON ORANGE TIMEOUT ST (SAFETY) ×4 IMPLANT
COVER MAYO STAND STRL (DRAPES) ×4 IMPLANT
DRSG COVADERM PLUS 2X2 (GAUZE/BANDAGES/DRESSINGS) ×4 IMPLANT
DRSG OPSITE POSTOP 3X4 (GAUZE/BANDAGES/DRESSINGS) ×4 IMPLANT
GLOVE BIO SURGEON STRL SZ7.5 (GLOVE) ×4 IMPLANT
GLOVE BIOGEL PI IND STRL 7.0 (GLOVE) ×2 IMPLANT
GLOVE BIOGEL PI IND STRL 7.5 (GLOVE) ×2 IMPLANT
GLOVE BIOGEL PI INDICATOR 7.0 (GLOVE) ×2
GLOVE BIOGEL PI INDICATOR 7.5 (GLOVE) ×2
GLOVE ECLIPSE 7.0 STRL STRAW (GLOVE) ×4 IMPLANT
GLOVE ECLIPSE 7.5 STRL STRAW (GLOVE) ×4 IMPLANT
GOWN STRL REUS W/TWL LRG LVL3 (GOWN DISPOSABLE) ×12 IMPLANT
LIQUID BAND (GAUZE/BANDAGES/DRESSINGS) IMPLANT
NS IRRIG 1000ML POUR BTL (IV SOLUTION) ×4 IMPLANT
PACK LAPAROSCOPY BASIN (CUSTOM PROCEDURE TRAY) ×4 IMPLANT
PAD POSITIONER PINK NONSTERILE (MISCELLANEOUS) ×4 IMPLANT
POUCH SPECIMEN RETRIEVAL 10MM (ENDOMECHANICALS) ×4 IMPLANT
PROTECTOR NERVE ULNAR (MISCELLANEOUS) ×4 IMPLANT
SET IRRIG TUBING LAPAROSCOPIC (IRRIGATION / IRRIGATOR) ×4 IMPLANT
SHEARS HARMONIC ACE PLUS 36CM (ENDOMECHANICALS) ×4 IMPLANT
SLEEVE XCEL OPT CAN 5 100 (ENDOMECHANICALS) IMPLANT
SOLUTION ELECTROLUBE (MISCELLANEOUS) IMPLANT
SUT CHROMIC 4 0 PS 2 18 (SUTURE) IMPLANT
SUT PLAIN 4 0 FS 2 27 (SUTURE) ×4 IMPLANT
SUT VIC AB 2-0 CT1 27 (SUTURE)
SUT VIC AB 2-0 CT1 TAPERPNT 27 (SUTURE) IMPLANT
SUT VICRYL 0 ENDOLOOP (SUTURE) IMPLANT
SUT VICRYL 0 UR6 27IN ABS (SUTURE) ×4 IMPLANT
SUT VICRYL 4-0 PS2 18IN ABS (SUTURE) IMPLANT
TOWEL OR 17X24 6PK STRL BLUE (TOWEL DISPOSABLE) ×8 IMPLANT
TROCAR XCEL NON-BLD 11X100MML (ENDOMECHANICALS) ×4 IMPLANT
TROCAR XCEL NON-BLD 5MMX100MML (ENDOMECHANICALS) ×4 IMPLANT
WARMER LAPAROSCOPE (MISCELLANEOUS) ×4 IMPLANT
WATER STERILE IRR 1000ML POUR (IV SOLUTION) ×4 IMPLANT

## 2014-12-30 NOTE — Anesthesia Procedure Notes (Signed)
Procedure Name: Intubation Date/Time: 12/30/2014 10:52 AM Performed by: Yolonda KidaARVER, Zane Pellecchia L Pre-anesthesia Checklist: Patient identified, Patient being monitored, Emergency Drugs available and Suction available Patient Re-evaluated:Patient Re-evaluated prior to inductionOxygen Delivery Method: Circle system utilized Preoxygenation: Pre-oxygenation with 100% oxygen Intubation Type: IV induction Ventilation: Two handed mask ventilation required and Oral airway inserted - appropriate to patient size Laryngoscope Size: Hyacinth MeekerMiller and 2 Grade View: Grade III Tube type: Oral Tube size: 7.0 mm Number of attempts: 1 Airway Equipment and Method: Bite block and Stylet Placement Confirmation: positive ETCO2,  CO2 detector and breath sounds checked- equal and bilateral Secured at: 22 cm Tube secured with: Tape Dental Injury: Teeth and Oropharynx as per pre-operative assessment

## 2014-12-30 NOTE — Op Note (Signed)
Willa FraterSara J Bonczek December 27, 1975 [id number]   Post Operative Note   Date of surgery:  12/30/2014  Pre Op Dx:  Pelvic pain, dyspareunia, left ovarian cystic mass  Post Op Dx:  Pelvic pain, dyspareunia, left ovarian cystic mass, right ovarian cystic mass, endometriosis suspect, posterior cul-de-sac adhesions, free-floating tubal sterilization clip  Procedure:  Laparoscopic bilateral ovarian cystectomy, lysis of posterior cul-de-sac adhesions, fulguration endometriosis, removal of tubal sterilization clips bilaterally  Surgeon:  [name],Lajuana Patchell P  Assistant:  Scrub technician  Anesthesia:  General  EBL:  minimal  Complications:  None  Specimen:  #1 opening cell washing #2 left ovarian cystic mass #3 right ovarian cystic mass #4 bilateral tubal sterilization clips, gross only to pathology  Findings: EUA:  External BUS vagina normal. Cervix normal. Uterus grossly normal in size midline mobile. Adnexa without masses   Operative:  Anterior cul-de-sac with minimal vesicouterine peritoneal scarring. Free-floating left tubal sterilization clip in anterior cul-de-sac. Posterior cul-de-sac with vale of filmy adhesions and single classic appearing powder burn mid posterior cul-de-sac. Uterus grossly normal size shape and contour. Left fallopian tube with absent mid tubal segment consistent with her history of tubal sterilization. Proximal and distal portions of the fallopian tube grossly normal. Right fallopian tube with absent mid tubal segment consistent with her history of tubal sterilization. Single clip noted pedunculated in the mesosalpinx easily removed with forceps. Proximal and distal portions of the fallopian tube grossly normal.  Left ovary with 3 cm dark cyst excised intact. Right ovary with corpus luteal appearing 3 cm cyst excised intact. Upper abdominal exam shows liver smooth, gallbladder not visualized. Appendix grossly normal free and mobile. No upper abdominal adhesions or other pathology  noted.  Procedure:  The patient was taken to the operating room, placed in the low dorsal lithotomy position, underwent general endotracheal anesthesia, received an abdominal/vaginal/perineal preparation with Betadine solution, bladder emptied with and in and out Foley catheterization and the EUA was performed. The timeout was performed by the surgical team. The patients cervix visualized with a speculum, grasped with a single-tooth tenaculum and Hulka tenaculum was placed without difficulty. The patient was draped in usual fashion. A transverse infraumbilical incision was made and using the 10 mm direct entry trocar the abdomen was directly entered under direct visualization without difficulty. The abdomen was then insufflated and right and left 5 mm suprapubic ports were placed under direct visualization after transillumination for the vessels without difficulty. Examination of the pelvic organs and upper abdominal exam was carried out with findings noted above. An opening cell washing was taken. The free-floating left sterilization clip in the anterior cul-de-sac was removed through the infraumbilical port. The left ovary was then elevated and stabilized and using the harmonic scalpel the cyst was excised at its junction with normal appearing ovary without difficulty, remaining intact. This was placed in the anterior cul-de-sac for future retrieval. A similar procedure was carried out on the right to remove the other cyst. A 5 mm laparoscope was placed through the right suprapubic port and using an Endopouch through the infraumbilical incision both cysts were placed within the pouch and removed from the patient not having to extend the infraumbilical incision. The cysts were individually labeled right and left and sent to pathology.  The abdomen was reinsufflated and bipolar cautery was applied to the ovarian stroma bilaterally to achieve ultimate hemostasis. The remaining sterilization clip was elevated and  removed with gentle traction without difficulty. Attention was then turned to the posterior cul-de-sac adhesions which were lysed  using the harmonic scalpel. The small pigmented implant was bipolar cauterized superficially. The pelvis was copiously irrigated showing hemostasis at all surgical sites, the gas slowly allowed to escape with continued hemostasis under low pressure situation.  The right and left 5 mm ports were removed showing hemostasis and no evidence of hernia formation. The infraumbilical port was backed out under direct visualization again showing adequate hemostasis and no evidence of hernia formation. All skin incisions were injected using 0.25% Marcaine and the infraumbilical port was closed using 0 Vicryl suture in an interrupted subcutaneous fascial stitch. All skin incisions were closed using Liquiban skin adhesive, the Hulka tenaculum was removed from the cervix and the patient awakened without difficulty and taken to recovery room in good condition.     [name] MD, 12:50 PM 12/30/2014

## 2014-12-30 NOTE — Discharge Instructions (Signed)
Postoperative Instructions Laparoscopy ° °Dr. Vauda Salvucci and the nursing staff have discussed postoperative instructions with you.  If you have any questions please ask them before you leave the hospital, or call Dr Kem Hensen’s office at [phone].   ° °We would like to emphasize the following instructions: ° ° °? Call the office to make your follow-up appointment as recommended by Dr Selassie Spatafore (usually 1-2 weeks). ° °? You were given a prescription, or one was ordered for you at the pharmacy you designated.  Get that prescription filled and take the medication according to instructions. ° °? You may eat a regular diet, but slowly until you start having bowel movements. ° °? Drink plenty of water daily. ° °? Nothing in the vagina (intercourse, douching, objects of any kind) for 2 weeks.  When reinitiating intercourse, if it is uncomfortable, stop and make an appointment with Dr Shawnda Mauney to be evaluated. ° °? No driving for several days until the anesthesia has worn off and you are not having significant pain.  Car rides (short) are ok, as long as you are not having significant pain, but no traveling out of town until your postoperative appointment. ° °? You may shower, but no baths for two weeks.  Walking up and down stairs is ok.  No heavy lifting, prolonged standing, repeated bending or any “working out” until your first  postoperative appointment. ° °? Rest frequently, listen to your body and do not push yourself and overdo it. ° °? Call if: ° °o Your pain medication does not seem strong enough. °o Worsening pain or abdominal bloating °o Persistent nausea or vomiting °o Difficulty with urination or bowel movements. °o Temperature of 101 degrees or higher. °o Heavy vaginal bleeding.  If your period is due, you may use tampons.   °o Incisions become red, tender or begin to drain. °o You have any questions or concerns °

## 2014-12-30 NOTE — Transfer of Care (Signed)
Immediate Anesthesia Transfer of Care Note  Patient: Regina FraterSara J Towers  Procedure(s) Performed: Procedure(s): LAPAROSCOPY DIAGNOSTIC (N/A) POSSIBLE LAPAROSCOPIC OVARIAN CYSTECTOMY (Left) LAPAROSCOPIC LYSIS OF ADHESIONS (N/A)  Patient Location: PACU  Anesthesia Type:General  Level of Consciousness: awake, alert , oriented and patient cooperative  Airway & Oxygen Therapy: Patient Spontanous Breathing and Patient connected to nasal cannula oxygen  Post-op Assessment: Report given to RN and Post -op Vital signs reviewed and stable  Post vital signs: Reviewed and stable  Last Vitals:  Filed Vitals:   12/30/14 0953  BP: 145/91  Pulse:   Temp:     Complications: No apparent anesthesia complications

## 2014-12-30 NOTE — Anesthesia Postprocedure Evaluation (Signed)
  Anesthesia Post Note  Patient: Regina Clark  Procedure(s) Performed: Procedure(s) (LRB): LAPAROSCOPY DIAGNOSTIC (N/A) POSSIBLE LAPAROSCOPIC OVARIAN CYSTECTOMY (Left) LAPAROSCOPIC LYSIS OF ADHESIONS (N/A)  Anesthesia type: GA  Patient location: PACU  Post pain: Pain level controlled  Post assessment: Post-op Vital signs reviewed  Last Vitals:  Filed Vitals:   12/30/14 1315  BP: 129/71  Pulse: 74  Temp:   Resp: 12    Post vital signs: Reviewed  Level of consciousness: sedated  Complications: No apparent anesthesia complications

## 2014-12-30 NOTE — H&P (Signed)
  The patient was examined.  I reviewed the proposed surgery and consent form with the patient.  The dictated history and physical is current and accurate and all questions were answered. I reviewed her LFT's mildly elevated, better than before.  Hepatitis panel negative before.  Asked patient to follow up post op to follow these.  The patient is ready to proceed with surgery and has a realistic understanding and expectation for the outcome.   [name],Karrington Mccravy P MD, 10:22 AM 12/30/2014

## 2014-12-31 ENCOUNTER — Encounter (HOSPITAL_COMMUNITY): Payer: Self-pay | Admitting: Gynecology

## 2015-01-14 ENCOUNTER — Ambulatory Visit: Payer: Managed Care, Other (non HMO) | Admitting: Gynecology

## 2015-01-19 ENCOUNTER — Telehealth: Payer: Self-pay

## 2015-01-19 NOTE — Telephone Encounter (Signed)
Patient had surgery (diag lap) 12/30/14 and wanted you to know she feels fantastic. She has not had post op appt as she had dire family emergency and had to travel to Mom who only had 3-4 weeks to live. She said to tell you her "incisions have cleared up nicely".  She will see you for post op when she returns.  Just wanted you to know she is fine.

## 2015-01-19 NOTE — Telephone Encounter (Signed)
Sounds great!

## 2015-01-19 NOTE — Telephone Encounter (Signed)
Dr. Dina RichF-just wanted to be sure you saw this note before I closed encounter?

## 2015-01-19 NOTE — Telephone Encounter (Signed)
Encounter already closed

## 2015-01-22 ENCOUNTER — Ambulatory Visit: Payer: Managed Care, Other (non HMO) | Admitting: Gynecology

## 2015-01-26 ENCOUNTER — Other Ambulatory Visit: Payer: Managed Care, Other (non HMO)

## 2015-01-26 ENCOUNTER — Ambulatory Visit: Payer: Managed Care, Other (non HMO) | Admitting: Gynecology

## 2015-11-11 ENCOUNTER — Other Ambulatory Visit (HOSPITAL_COMMUNITY)
Admission: RE | Admit: 2015-11-11 | Discharge: 2015-11-11 | Disposition: A | Discharge status: Home / Self Care | Payer: Managed Care, Other (non HMO) | Source: Ambulatory Visit | Attending: Gynecology | Admitting: Gynecology

## 2015-11-11 ENCOUNTER — Encounter: Payer: Self-pay | Admitting: Gynecology

## 2015-11-11 ENCOUNTER — Ambulatory Visit (INDEPENDENT_AMBULATORY_CARE_PROVIDER_SITE_OTHER): Payer: Managed Care, Other (non HMO) | Admitting: Gynecology

## 2015-11-11 VITALS — BP 124/78 | Ht 66.0 in | Wt 222.0 lb

## 2015-11-11 DIAGNOSIS — Z01419 Encounter for gynecological examination (general) (routine) without abnormal findings: Secondary | ICD-10-CM | POA: Diagnosis present

## 2015-11-11 NOTE — Addendum Note (Signed)
Addended by: Dayna BarkerGARDNER, Morrissa Shein K on: 11/11/2015 11:59 AM   Modules accepted: Orders

## 2015-11-11 NOTE — Progress Notes (Signed)
Regina FraterSara J Clark 07-07-1976 [id number]        40 y.o.  G3P3003  for annual exam.  Doing well without complaints.  Past medical history,surgical history, problem list, medications, allergies, family history and social history were all reviewed and documented as reviewed in the EPIC chart.  ROS:  Performed with pertinent positives and negatives included in the history, assessment and plan.   Additional significant findings :  none   Exam: Kennon PortelaKim Gardner assistant Filed Vitals:   11/11/15 1051  BP: 124/78  Height: 5\' 6"  (1.676 m)  Weight: 222 lb (100.699 kg)   General appearance:  Normal affect, orientation and appearance. Skin: Grossly normal HEENT: Without gross lesions.  No cervical or supraclavicular adenopathy. Thyroid normal.  Lungs:  Clear without wheezing, rales or rhonchi Cardiac: RR, without RMG Abdominal:  Soft, nontender, without masses, guarding, rebound, organomegaly or hernia Breasts:  Examined lying and sitting without masses, retractions, discharge or axillary adenopathy. Pelvic:  Ext/BUS/vagina normal  Cervix normal. Pap smear done  Uterus anteverted, normal size, shape and contour, midline and mobile nontender   Adnexa  Without masses or tenderness    Anus and perineum  Normal   Rectovaginal  Normal sphincter tone without palpated masses or tenderness.    Assessment/Plan:  40 y.o. 643P3003 female for annual exam with regular menses, tubal sterilization.  History of laparoscopic bilateral ovarian cystectomy/lysis of adhesions earlier this year. Doing well without pain.  1. Pap smear 2013. Pap smear done today. No history of significant abnormal Pap smears. 2. Mammography. Patient turning 40 the end of this year and have recommended she schedule a screening mammogram then and she agrees to do so. SBE monthly reviewed. 3. Health maintenance. Patient recently had routine blood work done through her primary physician's office. Being followed for elevated LFTs with negative  workup to date. We'll continue to follow up with her primary reference to this. No lab work done today.  Follow up in one year, sooner as needed.   [name],Regina Clark P MD, 11:23 AM 11/11/2015

## 2015-11-11 NOTE — Patient Instructions (Signed)
Schedule your mammogram when you turn 40.  You may obtain a copy of any labs that were done today by logging onto MyChart as outlined in the instructions provided with your AVS (after visit summary). The office will not call with normal lab results but certainly if there are any significant abnormalities then we will contact you.   Health Maintenance Adopting a healthy lifestyle and getting preventive care can go a long way to promote health and wellness. Talk with your health care provider about what schedule of regular examinations is right for you. This is a good chance for you to check in with your provider about disease prevention and staying healthy. In between checkups, there are plenty of things you can do on your own. Experts have done a lot of research about which lifestyle changes and preventive measures are most likely to keep you healthy. Ask your health care provider for more information. WEIGHT AND DIET  Eat a healthy diet  Be sure to include plenty of vegetables, fruits, low-fat dairy products, and lean protein.  Do not eat a lot of foods high in solid fats, added sugars, or salt.  Get regular exercise. This is one of the most important things you can do for your health.  Most adults should exercise for at least 150 minutes each week. The exercise should increase your heart rate and make you sweat (moderate-intensity exercise).  Most adults should also do strengthening exercises at least twice a week. This is in addition to the moderate-intensity exercise.  Maintain a healthy weight  Body mass index (BMI) is a measurement that can be used to identify possible weight problems. It estimates body fat based on height and weight. Your health care provider can help determine your BMI and help you achieve or maintain a healthy weight.  For females 19 years of age and older:   A BMI below 18.5 is considered underweight.  A BMI of 18.5 to 24.9 is normal.  A BMI of 25 to 29.9 is  considered overweight.  A BMI of 30 and above is considered obese.  Watch levels of cholesterol and blood lipids  You should start having your blood tested for lipids and cholesterol at 40 years of age, then have this test every 5 years.  You may need to have your cholesterol levels checked more often if:  Your lipid or cholesterol levels are high.  You are older than 40 years of age.  You are at high risk for heart disease.  CANCER SCREENING   Lung Cancer  Lung cancer screening is recommended for adults 54-2 years old who are at high risk for lung cancer because of a history of smoking.  A yearly low-dose CT scan of the lungs is recommended for people who:  Currently smoke.  Have quit within the past 15 years.  Have at least a 30-pack-year history of smoking. A pack year is smoking an average of one pack of cigarettes a day for 1 year.  Yearly screening should continue until it has been 15 years since you quit.  Yearly screening should stop if you develop a health problem that would prevent you from having lung cancer treatment.  Breast Cancer  Practice breast self-awareness. This means understanding how your breasts normally appear and feel.  It also means doing regular breast self-exams. Let your health care provider know about any changes, no matter how small.  If you are in your 20s or 30s, you should have a clinical breast exam (  CBE) by a health care provider every 1-3 years as part of a regular health exam.  If you are 56 or older, have a CBE every year. Also consider having a breast X-ray (mammogram) every year.  If you have a family history of breast cancer, talk to your health care provider about genetic screening.  If you are at high risk for breast cancer, talk to your health care provider about having an MRI and a mammogram every year.  Breast cancer gene (BRCA) assessment is recommended for women who have family members with BRCA-related cancers.  BRCA-related cancers include:  Breast.  Ovarian.  Tubal.  Peritoneal cancers.  Results of the assessment will determine the need for genetic counseling and BRCA1 and BRCA2 testing. Cervical Cancer Routine pelvic examinations to screen for cervical cancer are no longer recommended for nonpregnant women who are considered low risk for cancer of the pelvic organs (ovaries, uterus, and vagina) and who do not have symptoms. A pelvic examination may be necessary if you have symptoms including those associated with pelvic infections. Ask your health care provider if a screening pelvic exam is right for you.   The Pap test is the screening test for cervical cancer for women who are considered at risk.  If you had a hysterectomy for a problem that was not cancer or a condition that could lead to cancer, then you no longer need Pap tests.  If you are older than 65 years, and you have had normal Pap tests for the past 10 years, you no longer need to have Pap tests.  If you have had past treatment for cervical cancer or a condition that could lead to cancer, you need Pap tests and screening for cancer for at least 20 years after your treatment.  If you no longer get a Pap test, assess your risk factors if they change (such as having a new sexual partner). This can affect whether you should start being screened again.  Some women have medical problems that increase their chance of getting cervical cancer. If this is the case for you, your health care provider may recommend more frequent screening and Pap tests.  The human papillomavirus (HPV) test is another test that may be used for cervical cancer screening. The HPV test looks for the virus that can cause cell changes in the cervix. The cells collected during the Pap test can be tested for HPV.  The HPV test can be used to screen women 39 years of age and older. Getting tested for HPV can extend the interval between normal Pap tests from three to  five years.  An HPV test also should be used to screen women of any age who have unclear Pap test results.  After 40 years of age, women should have HPV testing as often as Pap tests.  Colorectal Cancer  This type of cancer can be detected and often prevented.  Routine colorectal cancer screening usually begins at 40 years of age and continues through 40 years of age.  Your health care provider may recommend screening at an earlier age if you have risk factors for colon cancer.  Your health care provider may also recommend using home test kits to check for hidden blood in the stool.  A small camera at the end of a tube can be used to examine your colon directly (sigmoidoscopy or colonoscopy). This is done to check for the earliest forms of colorectal cancer.  Routine screening usually begins at age 77.  Direct examination of the colon should be repeated every 5-10 years through 40 years of age. However, you may need to be screened more often if early forms of precancerous polyps or small growths are found. Skin Cancer  Check your skin from head to toe regularly.  Tell your health care provider about any new moles or changes in moles, especially if there is a change in a mole's shape or color.  Also tell your health care provider if you have a mole that is larger than the size of a pencil eraser.  Always use sunscreen. Apply sunscreen liberally and repeatedly throughout the day.  Protect yourself by wearing long sleeves, pants, a wide-brimmed hat, and sunglasses whenever you are outside. HEART DISEASE, DIABETES, AND HIGH BLOOD PRESSURE   Have your blood pressure checked at least every 1-2 years. High blood pressure causes heart disease and increases the risk of stroke.  If you are between 76 years and 55 years old, ask your health care provider if you should take aspirin to prevent strokes.  Have regular diabetes screenings. This involves taking a blood sample to check your  fasting blood sugar level.  If you are at a normal weight and have a low risk for diabetes, have this test once every three years after 40 years of age.  If you are overweight and have a high risk for diabetes, consider being tested at a younger age or more often. PREVENTING INFECTION  Hepatitis B  If you have a higher risk for hepatitis B, you should be screened for this virus. You are considered at high risk for hepatitis B if:  You were born in a country where hepatitis B is common. Ask your health care provider which countries are considered high risk.  Your parents were born in a high-risk country, and you have not been immunized against hepatitis B (hepatitis B vaccine).  You have HIV or AIDS.  You use needles to inject street drugs.  You live with someone who has hepatitis B.  You have had sex with someone who has hepatitis B.  You get hemodialysis treatment.  You take certain medicines for conditions, including cancer, organ transplantation, and autoimmune conditions. Hepatitis C  Blood testing is recommended for:  Everyone born from [birth year] through 1965.  Anyone with known risk factors for hepatitis C. Sexually transmitted infections (STIs)  You should be screened for sexually transmitted infections (STIs) including gonorrhea and chlamydia if:  You are sexually active and are younger than 40 years of age.  You are older than 40 years of age and your health care provider tells you that you are at risk for this type of infection.  Your sexual activity has changed since you were last screened and you are at an increased risk for chlamydia or gonorrhea. Ask your health care provider if you are at risk.  If you do not have HIV, but are at risk, it may be recommended that you take a prescription medicine daily to prevent HIV infection. This is called pre-exposure prophylaxis (PrEP). You are considered at risk if:  You are sexually active and do not regularly use condoms or  know the HIV status of your partner(s).  You take drugs by injection.  You are sexually active with a partner who has HIV. Talk with your health care provider about whether you are at high risk of being infected with HIV. If you choose to begin PrEP, you should first be tested for HIV. You should then be  tested every 3 months for as long as you are taking PrEP.  PREGNANCY   If you are premenopausal and you may become pregnant, ask your health care provider about preconception counseling.  If you may become pregnant, take 400 to 800 micrograms (mcg) of folic acid every day.  If you want to prevent pregnancy, talk to your health care provider about birth control (contraception). OSTEOPOROSIS AND MENOPAUSE   Osteoporosis is a disease in which the bones lose minerals and strength with aging. This can result in serious bone fractures. Your risk for osteoporosis can be identified using a bone density scan.  If you are 25 years of age or older, or if you are at risk for osteoporosis and fractures, ask your health care provider if you should be screened.  Ask your health care provider whether you should take a calcium or vitamin D supplement to lower your risk for osteoporosis.  Menopause may have certain physical symptoms and risks.  Hormone replacement therapy may reduce some of these symptoms and risks. Talk to your health care provider about whether hormone replacement therapy is right for you.  HOME CARE INSTRUCTIONS   Schedule regular health, dental, and eye exams.  Stay current with your immunizations.   Do not use any tobacco products including cigarettes, chewing tobacco, or electronic cigarettes.  If you are pregnant, do not drink alcohol.  If you are breastfeeding, limit how much and how often you drink alcohol.  Limit alcohol intake to no more than 1 drink per day for nonpregnant women. One drink equals 12 ounces of beer, 5 ounces of wine, or 1 ounces of hard liquor.  Do  not use street drugs.  Do not share needles.  Ask your health care provider for help if you need support or information about quitting drugs.  Tell your health care provider if you often feel depressed.  Tell your health care provider if you have ever been abused or do not feel safe at home. Document Released: 05/09/2011 Document Revised: 03/10/2014 Document Reviewed: 09/25/2013 [facility] Patient Information 2015 [place], [place]. This information is not intended to replace advice given to you by your health care provider. Make sure you discuss any questions you have with your health care provider.

## 2015-11-13 LAB — CYTOLOGY - PAP

## 2015-12-28 ENCOUNTER — Encounter (HOSPITAL_COMMUNITY): Payer: Self-pay

## 2015-12-28 ENCOUNTER — Emergency Department (HOSPITAL_COMMUNITY): Payer: Managed Care, Other (non HMO)

## 2015-12-28 ENCOUNTER — Emergency Department (HOSPITAL_COMMUNITY)
Admission: EM | Admit: 2015-12-28 | Discharge: 2015-12-28 | Disposition: A | Discharge status: Home / Self Care | Payer: Managed Care, Other (non HMO) | Attending: Emergency Medicine | Admitting: Emergency Medicine

## 2015-12-28 DIAGNOSIS — M545 Low back pain, unspecified: Secondary | ICD-10-CM

## 2015-12-28 DIAGNOSIS — K219 Gastro-esophageal reflux disease without esophagitis: Secondary | ICD-10-CM | POA: Insufficient documentation

## 2015-12-28 DIAGNOSIS — Z8742 Personal history of other diseases of the female genital tract: Secondary | ICD-10-CM | POA: Insufficient documentation

## 2015-12-28 DIAGNOSIS — M546 Pain in thoracic spine: Secondary | ICD-10-CM | POA: Diagnosis not present

## 2015-12-28 DIAGNOSIS — R109 Unspecified abdominal pain: Secondary | ICD-10-CM | POA: Insufficient documentation

## 2015-12-28 DIAGNOSIS — Z87891 Personal history of nicotine dependence: Secondary | ICD-10-CM | POA: Diagnosis not present

## 2015-12-28 DIAGNOSIS — Z880 Allergy status to penicillin: Secondary | ICD-10-CM | POA: Diagnosis not present

## 2015-12-28 DIAGNOSIS — Z3202 Encounter for pregnancy test, result negative: Secondary | ICD-10-CM | POA: Insufficient documentation

## 2015-12-28 DIAGNOSIS — Z86010 Personal history of colonic polyps: Secondary | ICD-10-CM | POA: Insufficient documentation

## 2015-12-28 DIAGNOSIS — Z79899 Other long term (current) drug therapy: Secondary | ICD-10-CM | POA: Insufficient documentation

## 2015-12-28 LAB — COMPREHENSIVE METABOLIC PANEL
ALBUMIN: 3.9 g/dL (ref 3.5–5.0)
ALT: 49 U/L (ref 14–54)
ANION GAP: 11 (ref 5–15)
AST: 44 U/L — ABNORMAL HIGH (ref 15–41)
Alkaline Phosphatase: 63 U/L (ref 38–126)
BUN: <5 mg/dL — ABNORMAL LOW (ref 6–20)
CALCIUM: 9 mg/dL (ref 8.9–10.3)
CO2: 23 mmol/L (ref 22–32)
CREATININE: 0.7 mg/dL (ref 0.44–1.00)
Chloride: 104 mmol/L (ref 101–111)
GFR calc non Af Amer: >60 mL/min (ref >60)
Glucose, Bld: 137 mg/dL — ABNORMAL HIGH (ref 65–99)
POTASSIUM: 3.9 mmol/L (ref 3.5–5.1)
SODIUM: 138 mmol/L (ref 135–145)
TOTAL PROTEIN: 7.4 g/dL (ref 6.5–8.1)
Total Bilirubin: 1.2 mg/dL (ref 0.3–1.2)

## 2015-12-28 LAB — CBC
HCT: 41.8 % (ref 36.0–46.0)
Hemoglobin: 14.4 g/dL (ref 12.0–15.0)
MCH: 31.5 pg (ref 26.0–34.0)
MCHC: 34.4 g/dL (ref 30.0–36.0)
MCV: 91.5 fL (ref 78.0–100.0)
PLATELETS: 184 10*3/uL (ref 150–400)
RBC: 4.57 MIL/uL (ref 3.87–5.11)
RDW: 12.6 % (ref 11.5–15.5)
WBC: 6.3 10*3/uL (ref 4.0–10.5)

## 2015-12-28 LAB — URINALYSIS, ROUTINE W REFLEX MICROSCOPIC
BILIRUBIN URINE: NEGATIVE
Glucose, UA: NEGATIVE mg/dL
HGB URINE DIPSTICK: NEGATIVE
Ketones, ur: NEGATIVE mg/dL
Leukocytes, UA: NEGATIVE
NITRITE: NEGATIVE
PROTEIN: NEGATIVE mg/dL
Specific Gravity, Urine: 1.013 (ref 1.005–1.030)
pH: 5.5 (ref 5.0–8.0)

## 2015-12-28 LAB — LIPASE, BLOOD: LIPASE: 29 U/L (ref 11–51)

## 2015-12-28 LAB — POC URINE PREG, ED: PREG TEST UR: NEGATIVE

## 2015-12-28 MED ORDER — METHOCARBAMOL 500 MG PO TABS: 500.0000 mg | ORAL_TABLET | Freq: Two times a day (BID) | ORAL | Status: DC

## 2015-12-28 MED ORDER — NAPROXEN 500 MG PO TABS: 500.0000 mg | ORAL_TABLET | Freq: Two times a day (BID) | ORAL | Status: DC

## 2015-12-28 MED ORDER — KETOROLAC TROMETHAMINE 60 MG/2ML IM SOLN
60.0000 mg | Freq: Once | INTRAMUSCULAR | Status: AC
  Administered 2015-12-28: 60 mg via INTRAMUSCULAR
  Filled 2015-12-28: qty 2

## 2015-12-28 NOTE — Discharge Instructions (Signed)
Take naproxen as prescribed for pain. Robaxin as prescribed as needed for spasms. Try heating pads and stretches. Follow up with primary care doctor. Return if worsening symptoms  Muscle Cramps and Spasms Muscle cramps and spasms occur when a muscle or muscles tighten and you have no control over this tightening (involuntary muscle contraction). They are a common problem and can develop in any muscle. The most common place is in the calf muscles of the leg. Both muscle cramps and muscle spasms are involuntary muscle contractions, but they also have differences:   Muscle cramps are sporadic and painful. They may last a few seconds to a quarter of an hour. Muscle cramps are often more forceful and last longer than muscle spasms.  Muscle spasms may or may not be painful. They may also last just a few seconds or much longer. CAUSES  It is uncommon for cramps or spasms to be due to a serious underlying problem. In many cases, the cause of cramps or spasms is unknown. Some common causes are:   Overexertion.   Overuse from repetitive motions (doing the same thing over and over).   Remaining in a certain position for a long period of time.   Improper preparation, form, or technique while performing a sport or activity.   Dehydration.   Injury.   Side effects of some medicines.   Abnormally low levels of the salts and ions in your blood (electrolytes), especially potassium and calcium. This could happen if you are taking water pills (diuretics) or you are pregnant.  Some underlying medical problems can make it more likely to develop cramps or spasms. These include, but are not limited to:   Diabetes.   Parkinson disease.   Hormone disorders, such as thyroid problems.   Alcohol abuse.   Diseases specific to muscles, joints, and bones.   Blood vessel disease where not enough blood is getting to the muscles.  HOME CARE INSTRUCTIONS   Stay well hydrated. Drink enough water  and fluids to keep your urine clear or pale yellow.  It may be helpful to massage, stretch, and relax the affected muscle.  For tight or tense muscles, use a warm towel, heating pad, or hot shower water directed to the affected area.  If you are sore or have pain after a cramp or spasm, applying ice to the affected area may relieve discomfort.  Put ice in a plastic bag.  Place a towel between your skin and the bag.  Leave the ice on for 15-20 minutes, 03-04 times a day.  Medicines used to treat a known cause of cramps or spasms may help reduce their frequency or severity. Only take over-the-counter or prescription medicines as directed by your caregiver. SEEK MEDICAL CARE IF:  Your cramps or spasms get more severe, more frequent, or do not improve over time.  MAKE SURE YOU:   Understand these instructions.  Will watch your condition.  Will get help right away if you are not doing well or get worse.   This information is not intended to replace advice given to you by your health care provider. Make sure you discuss any questions you have with your health care provider.   Document Released: 04/15/2002 Document Revised: 02/18/2013 Document Reviewed: 10/10/2012 Elsevier Interactive Patient Education [employer]

## 2015-12-28 NOTE — ED Notes (Signed)
Patient here with left sided lumbar/flank pain since Friday, pain worse with movement, describes as stabbing pain. Denies urinary symptoms

## 2015-12-28 NOTE — ED Provider Notes (Signed)
CSN: [id number]     Arrival date & time 12/28/15  2130 History   First MD Initiated Contact with Patient 12/28/15 1024     Chief Complaint  Patient presents with  . Back Pain     (Consider location/radiation/quality/duration/timing/severity/associated sxs/prior Treatment) HPI GALI SPINNEY is a 40 y.o. female with hx of colitis, pancreatitis, presents to ED with complaint of left flank pain. Pt states pain started 2 days ago, but became severe today. Pain does not radiate. Worse with movement and laying flat. Denies urinary symptoms. No fever or chills. No cough or shortness of breath. No injuries. States pain comes and goes. No numbness or weakness in extremities. No abdominal pain. Pt states she felt constipated and took stool softner after not having a BM in 4 days, had small BM today. No N/V. No other tx prior to coming in.   Past Medical History  Diagnosis Date  . Endometrial hyperplasia, simple 2011    Endometrial Bx  . Colitis 2009    Hx  . Pancreatitis 2007    h/o  . History of colon polyps   . SVD (spontaneous vaginal delivery)     x 1  . GERD (gastroesophageal reflux disease)     occasional  . Elevated LFTs 12/2014    Negative Hepatitis panel   Past Surgical History  Procedure Laterality Date  . Cholecystectomy    . Tonsillectomy    . Cesarean section      x 2   . Tubal ligation    . Wisdom tooth extraction    . Laparoscopy N/A 12/30/2014    Procedure: LAPAROSCOPY DIAGNOSTIC;  Surgeon: [name], MD;  Location: [facility];  Service: Gynecology;  Laterality: N/A;  . Laparoscopic ovarian cystectomy Left 12/30/2014    Procedure: POSSIBLE LAPAROSCOPIC OVARIAN CYSTECTOMY;  Surgeon: [name], MD;  Location: [facility];  Service: Gynecology;  Laterality: Left;  . Laparoscopic lysis of adhesions N/A 12/30/2014    Procedure: LAPAROSCOPIC LYSIS OF ADHESIONS;  Surgeon: [name], MD;  Location: [facility];  Service: Gynecology;  Laterality: N/A;   Family  History  Problem Relation Age of Onset  . Hypertension Mother   . Kidney failure Mother   . Alcoholism Mother   . Colon cancer Father   . Diabetes Father   . Cancer Father 89    colon  . Cancer Paternal Grandmother     colon   Social History  Substance Use Topics  . Smoking status: Former Smoker -- 0.50 packs/day for 5 years    Types: Cigarettes    Quit date: 10/07/2004  . Smokeless tobacco: Never Used  . Alcohol Use: 0.0 oz/week    0 Standard drinks or equivalent per week     Comment: Rare   OB History    Gravida Para Term Preterm AB TAB SAB Ectopic Multiple Living   Review of Systems  Constitutional: Negative for fever and chills.  Respiratory: Negative for cough, chest tightness and shortness of breath.   Cardiovascular: Negative for chest pain, palpitations and leg swelling.  Gastrointestinal: Negative for nausea, vomiting, abdominal pain and diarrhea.  Genitourinary: Positive for flank pain. Negative for dysuria, vaginal bleeding, vaginal discharge, vaginal pain and pelvic pain.  Musculoskeletal: Positive for back pain. Negative for myalgias, arthralgias, neck pain and neck stiffness.  Skin: Negative for rash.  Neurological: Negative for dizziness, weakness and headaches.  All other systems  reviewed and are negative.     Allergies  Penicillins and Sulfa antibiotics  Home Medications   Prior to Admission medications   Medication Sig Start Date End Date Taking? Authorizing Provider  diazepam (VALIUM) 5 MG tablet Take 2.5 mg by mouth every 6 (six) hours as needed for anxiety.   Yes Historical Provider, MD  esomeprazole (NEXIUM) 20 MG capsule Take 20 mg by mouth daily as needed (heartburn).   Yes Historical Provider, MD  HYDROcodone-acetaminophen (NORCO/VICODIN) 5-325 MG tablet Take 1 tablet by mouth every 6 (six) hours as needed for moderate pain.   Yes Historical Provider, MD  Sennosides-Docusate Sodium (STOOL SOFTENER & LAXATIVE PO) Take 1  tablet by mouth daily.   Yes Historical Provider, MD   BP 171/111 mmHg  Pulse 85  Temp(Src) 99.1 F (37.3 C) (Oral)  Resp 16  Ht 5\' 6"  (1.676 m)  Wt 99.791 kg  BMI 35.53 kg/m2  SpO2 97%  LMP 12/21/2015 Physical Exam  Constitutional: She appears well-developed and well-nourished. No distress.  HENT:  Head: Normocephalic.  Eyes: Conjunctivae are normal.  Neck: Neck supple.  Cardiovascular: Normal rate, regular rhythm and normal heart sounds.   Pulmonary/Chest: Effort normal and breath sounds normal. No respiratory distress. She has no wheezes. She has no rales.  Abdominal: Soft. Bowel sounds are normal. She exhibits no distension. There is no tenderness. There is no rebound.  Musculoskeletal: She exhibits no edema.  ttp to the left mid back over perispinal muscles. No midline tenderness. No pain with straight leg raise  Neurological: She is alert.  Skin: Skin is warm and dry.  Psychiatric: She has a normal mood and affect. Her behavior is normal.  Nursing note and vitals reviewed.   ED Course  Procedures (including critical care time) Labs Review Labs Reviewed  COMPREHENSIVE METABOLIC PANEL - Abnormal; Notable for the following:    Glucose, Bld 137 (*)    BUN <5 (*)    AST 44 (*)    All other components within normal limits  LIPASE, BLOOD  CBC  URINALYSIS, ROUTINE W REFLEX MICROSCOPIC (NOT AT [facility])  POC URINE PREG, ED    Imaging Review Ct Abdomen Pelvis Wo Contrast  12/28/2015  CLINICAL DATA:  Left flank pain for 3 days.  Nausea.  Constipation. EXAM: CT ABDOMEN AND PELVIS WITHOUT CONTRAST TECHNIQUE: Multidetector CT imaging of the abdomen and pelvis was performed following the standard protocol without IV contrast. COMPARISON:  12/16/2007 FINDINGS: Lower chest:  No acute findings. Hepatobiliary: No mass visualized on this un-enhanced exam. Prior cholecystectomy noted. No evidence of biliary dilatation. Pancreas: No mass or inflammatory process identified on this  un-enhanced exam. Spleen: Within normal limits in size. Adrenals/Urinary Tract: No evidence of urolithiasis or hydronephrosis. No definite mass visualized on this un-enhanced exam. Stomach/Bowel: No evidence of obstruction, inflammatory process, or abnormal fluid collections. Normal appendix visualized. Vascular/Lymphatic: No pathologically enlarged lymph nodes. No evidence of abdominal aortic aneurysm. Reproductive: No mass or other significant abnormality. Other: None. Musculoskeletal:  No suspicious bone lesions identified. IMPRESSION: No evidence of urolithiasis, hydronephrosis, or other acute findings. Electronically Signed   By: Myles Rosenthal M.D.   On: 12/28/2015 12:18   I have personally reviewed and evaluated these images and lab results as part of my medical decision-making.   EKG Interpretation None      MDM   Final diagnoses:  Left-sided low back pain without sciatica   Pt with left flank pain, pain waxes and wanes. No associated symptoms. Pain reproducible  with palpation of the left lower back. Labs and UA unremarkable. Will get CT abd/pelvis to ro kidney stone.   1:11 PM CT negative. Most likely musculoskeletal pain, spasms. Patient feels better after Toradol. Will discharge home with Robaxin, naproxen. Advised to try heating pads and stretches. She will follow-up with her massage therapist and PCP  Filed Vitals:   12/28/15 0853 12/28/15 1138 12/28/15 1200  BP: 171/111 145/98 143/93  Pulse: 85 78 70  Temp: 99.1 F (37.3 C)    TempSrc: Oral    Resp: 16    Height:  (1.676 m)    Weight: 99.791 kg    SpO2: 97% 98% 96%     Jaynie Crumble, PA-C 12/28/15 1315  Margarita Grizzle, MD 12/29/15 [phone]

## 2016-11-04 ENCOUNTER — Other Ambulatory Visit: Payer: Self-pay | Admitting: Family Medicine

## 2016-11-04 DIAGNOSIS — R748 Abnormal levels of other serum enzymes: Secondary | ICD-10-CM

## 2016-11-10 ENCOUNTER — Ambulatory Visit
Admission: RE | Admit: 2016-11-10 | Discharge: 2016-11-10 | Disposition: A | Discharge status: Home / Self Care | Payer: Managed Care, Other (non HMO) | Source: Ambulatory Visit | Attending: Family Medicine | Admitting: Family Medicine

## 2016-11-10 ENCOUNTER — Other Ambulatory Visit: Payer: Managed Care, Other (non HMO)

## 2016-11-10 DIAGNOSIS — R748 Abnormal levels of other serum enzymes: Secondary | ICD-10-CM

## 2016-11-11 ENCOUNTER — Ambulatory Visit (INDEPENDENT_AMBULATORY_CARE_PROVIDER_SITE_OTHER): Payer: Managed Care, Other (non HMO) | Admitting: Gynecology

## 2016-11-11 ENCOUNTER — Encounter: Payer: Self-pay | Admitting: Gynecology

## 2016-11-11 VITALS — BP 118/76 | Ht 66.0 in | Wt 223.0 lb

## 2016-11-11 DIAGNOSIS — Z01419 Encounter for gynecological examination (general) (routine) without abnormal findings: Secondary | ICD-10-CM | POA: Diagnosis not present

## 2016-11-11 NOTE — Progress Notes (Signed)
    Regina Clark [birth year] [id number]        41 y.o.  G3P3003 for annual exam.    Past medical history,surgical history, problem list, medications, allergies, family history and social history were all reviewed and documented as reviewed in the EPIC chart.  ROS:  Performed with pertinent positives and negatives included in the history, assessment and plan.   Additional significant findings :  Currently being evaluated for elevated LFTs. Had normal ultrasound right upper quadrant yesterday.  Exam: Regina PortelaKim Clark assistant Vitals:   11/11/16 1206  BP: 118/76  Weight: 223 lb (101.2 kg)  Height: 5\' 6"  (1.676 m)   Body mass index is 35.99 kg/m.  General appearance:  Normal affect, orientation and appearance. Skin: Grossly normal HEENT: Without gross lesions.  No cervical or supraclavicular adenopathy. Thyroid normal.  Lungs:  Clear without wheezing, rales or rhonchi Cardiac: RR, without RMG Abdominal:  Soft, nontender, without masses, guarding, rebound, organomegaly or hernia Breasts:  Examined lying and sitting without masses, retractions, discharge or axillary adenopathy. Pelvic:  Ext, BUS, Vagina normal  Cervix normal  Uterus anteverted, normal size, shape and contour, midline and mobile nontender   Adnexa without masses or tenderness    Anus and perineum normal   Rectovaginal normal sphincter tone without palpated masses or tenderness.    Assessment/Plan:  41 y.o. [id number] female for annual exam with regular menses, tubal sterilization.   1. Pap smear 2017. No Pap smear done today. No history of significant abnormal Pap smears previously. 2. Mammography 10/2016. Continue with annual mammography when due. SBE monthly reviewed. 3. Health maintenance. No routine lab work done as she has this done at her primary physician's office. Follow up 1 year, sooner as needed.   Regina Clark,Regina Clark P MD, 12:24 PM 11/11/2016

## 2016-11-11 NOTE — Patient Instructions (Signed)

## 2017-11-20 ENCOUNTER — Encounter: Payer: Managed Care, Other (non HMO) | Admitting: Gynecology

## 2017-12-28 ENCOUNTER — Encounter: Payer: Managed Care, Other (non HMO) | Admitting: Gynecology

## 2018-02-16 ENCOUNTER — Encounter: Payer: Managed Care, Other (non HMO) | Admitting: Gynecology

## 2018-04-17 ENCOUNTER — Ambulatory Visit (INDEPENDENT_AMBULATORY_CARE_PROVIDER_SITE_OTHER): Payer: Managed Care, Other (non HMO) | Admitting: Gynecology

## 2018-04-17 ENCOUNTER — Encounter: Payer: Self-pay | Admitting: Gynecology

## 2018-04-17 VITALS — BP 134/84 | Ht 66.0 in | Wt 218.0 lb

## 2018-04-17 DIAGNOSIS — Z01419 Encounter for gynecological examination (general) (routine) without abnormal findings: Secondary | ICD-10-CM | POA: Diagnosis not present

## 2018-04-17 DIAGNOSIS — Z1151 Encounter for screening for human papillomavirus (HPV): Secondary | ICD-10-CM | POA: Diagnosis not present

## 2018-04-17 NOTE — Progress Notes (Signed)
    Regina FraterSara J Clark 1976-09-17 [id number]        42 y.o.  [id number] for annual gynecologic exam.  Without gynecologic complaints  Past medical history,surgical history, problem list, medications, allergies, family history and social history were all reviewed and documented as reviewed in the EPIC chart.  ROS:  Performed with pertinent positives and negatives included in the history, assessment and plan.   Additional significant findings : None   Exam: Kennon PortelaKim Gardner assistant Vitals:   04/17/18 1511  BP: 134/84  Weight: 218 lb (98.9 kg)  Height: 5\' 6"  (1.676 m)   Body mass index is 35.19 kg/m.  General appearance:  Normal affect, orientation and appearance. Skin: Grossly normal HEENT: Without gross lesions.  No cervical or supraclavicular adenopathy. Thyroid normal.  Lungs:  Clear without wheezing, rales or rhonchi Cardiac: RR, without RMG Abdominal:  Soft, nontender, without masses, guarding, rebound, organomegaly or hernia Breasts:  Examined lying and sitting without masses, retractions, discharge or axillary adenopathy. Pelvic:  Ext, BUS, Vagina: Normal  Cervix: Normal  Uterus: Anteverted, normal size, shape and contour, midline and mobile nontender   Adnexa: Without masses or tenderness    Anus and perineum: Normal with old external hemorrhoids  Rectovaginal: Normal sphincter tone without palpated masses or tenderness.    Assessment/Plan:  42 y.o. [id number] female for annual gynecologic exam with regular menses, tubal sterilization.   1. Pap smear 11/2015.  Pap smear/HPV today.  No history of abnormal Pap smears previously. 2. Old external hemorrhoids.  Occasionally flares.  She asked about hemorrhoid surgery.  We discussed this in general and she is not interested in pursuing this.  She will call if she changes her mind. 3. Mammography 11/2017.  Continue with annual mammography when due.  Breast exam normal today. 4. Health maintenance.  Blood pressure 134/84 noted to the  patient.  She is going to recheck in the nonexam situation.  If it remains elevated she will follow-up with her primary physician.  No routine lab work done as patient does this elsewhere.  Follow-up 1 year, sooner as needed.   Dara Lordsimothy P Fontaine MD, 3:56 PM 04/17/2018

## 2018-04-17 NOTE — Patient Instructions (Signed)
Follow-up in 1 year for annual exam, sooner if any issues. 

## 2018-04-17 NOTE — Addendum Note (Signed)
Addended by: Dayna BarkerGARDNER, Marc Sivertsen K on: 04/17/2018 04:00 PM   Modules accepted: Orders

## 2018-04-18 LAB — PAP IG AND HPV HIGH-RISK: HPV DNA High Risk: NOT DETECTED

## 2018-09-09 IMAGING — US US ABDOMEN LIMITED
1 series · 14 of 25 positions shown · non-contrast
Comparison: 12/28/2015

CLINICAL DATA: Abnormal AST and ALT

EXAM:
US ABDOMEN LIMITED - RIGHT UPPER QUADRANT

[Series 1: us abdomen limited · 0.33mm/px · 14 of 30 slices shown]
[im 1/30]
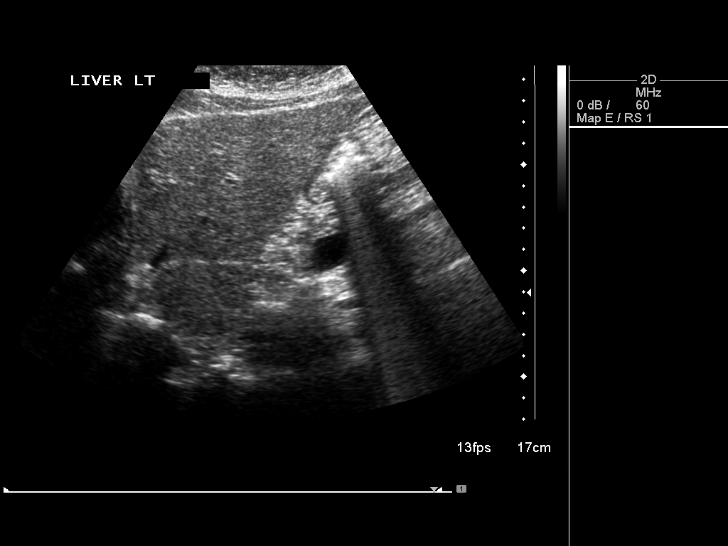
[im 3/30]
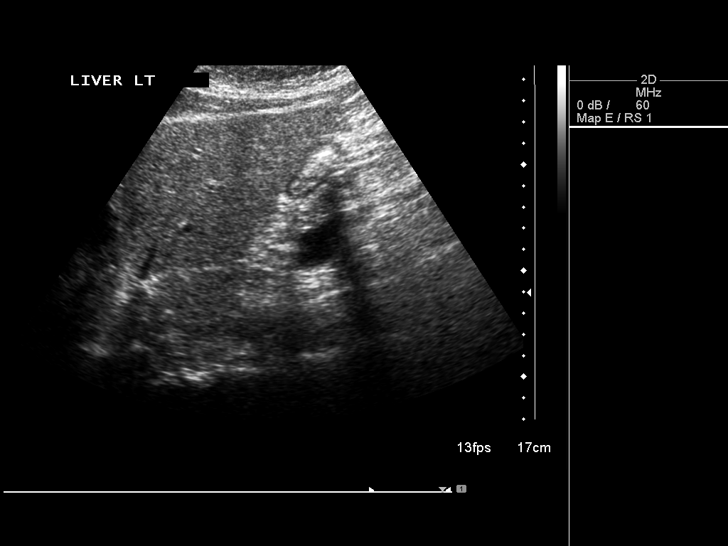
[im 5/30]
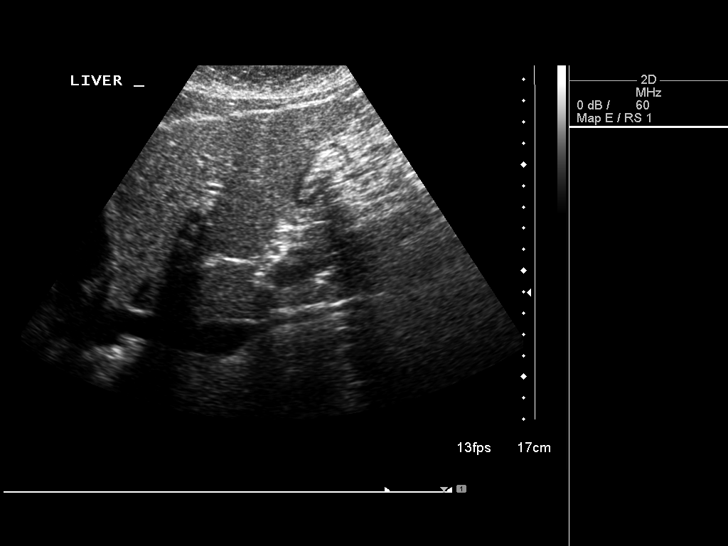
[im 8/30]
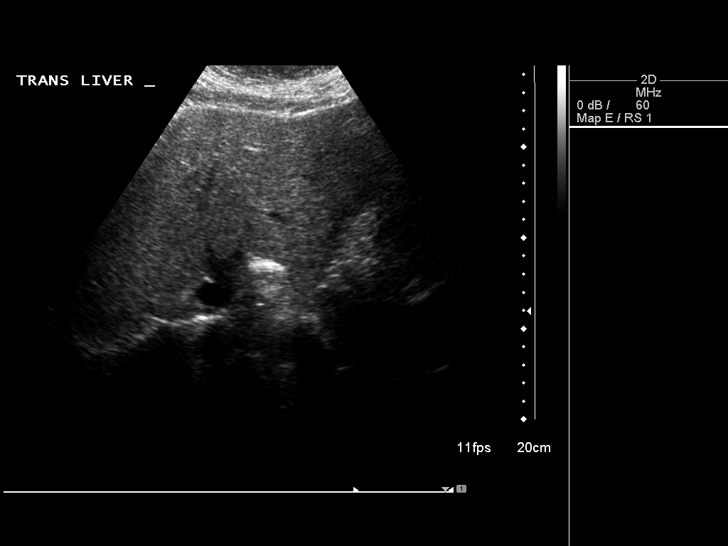
[im 10/30]
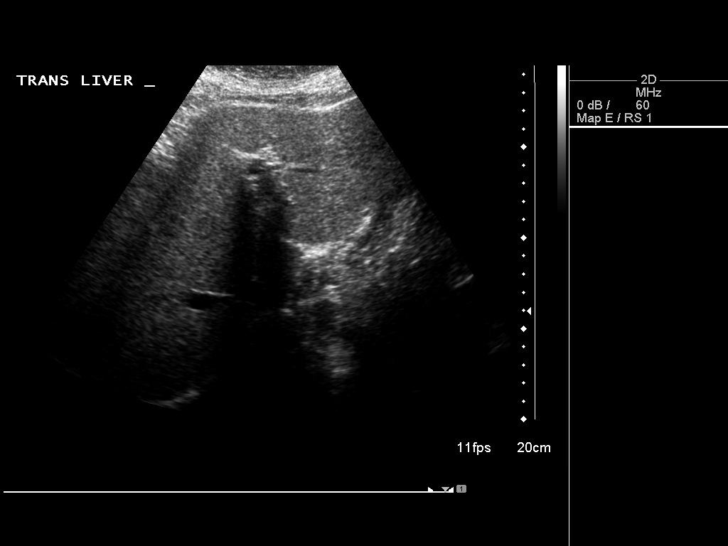
[im 11/30]
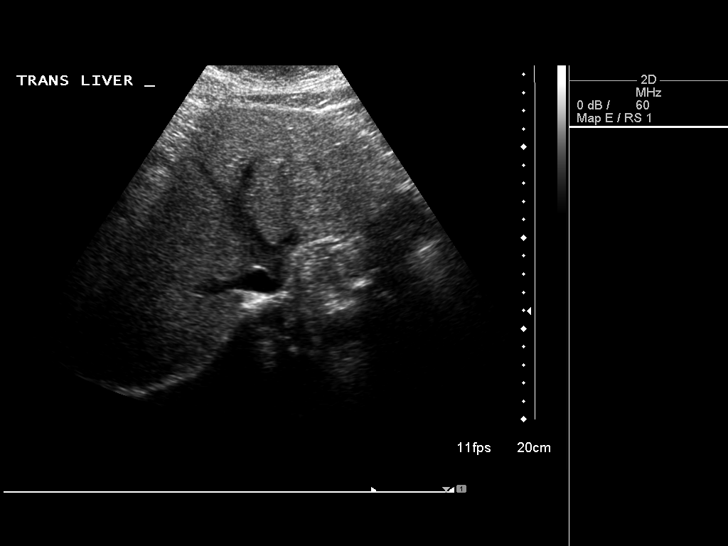
[im 14/30]
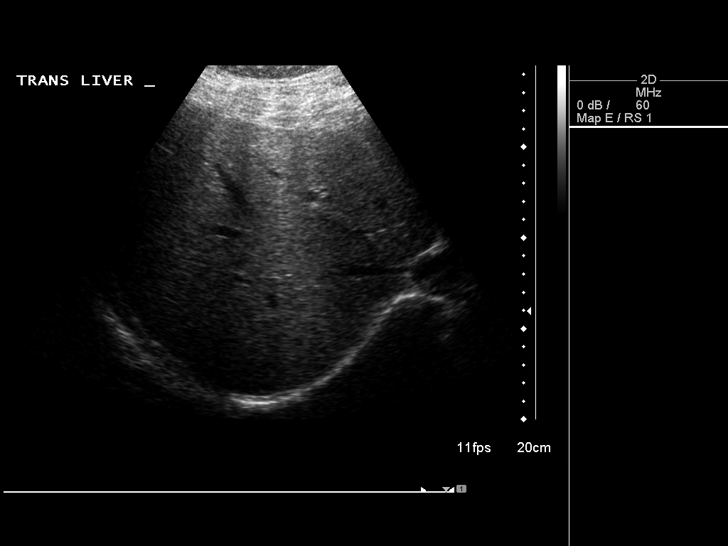
[im 16/30]
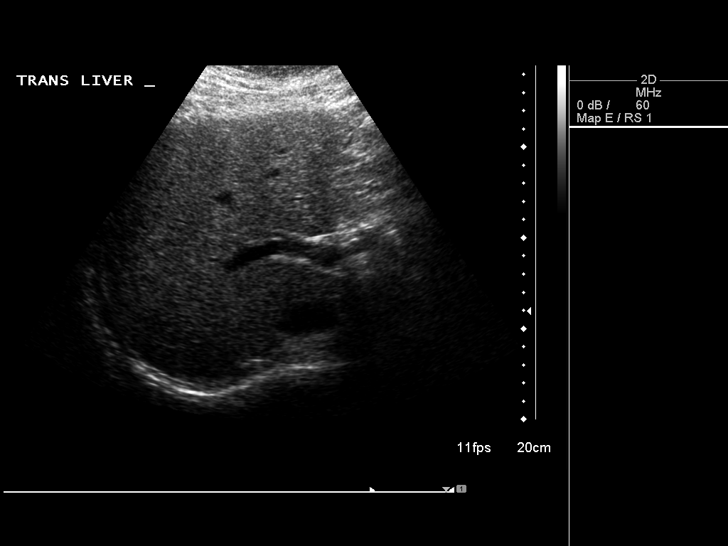
[im 19/30]
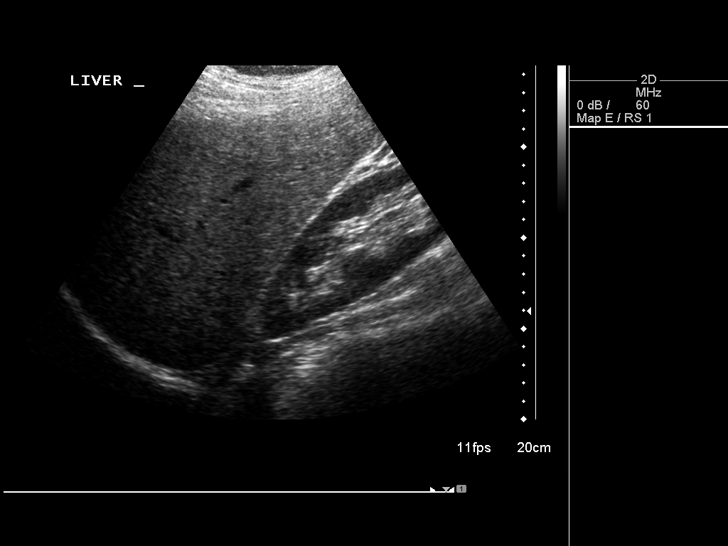
[im 20/30]
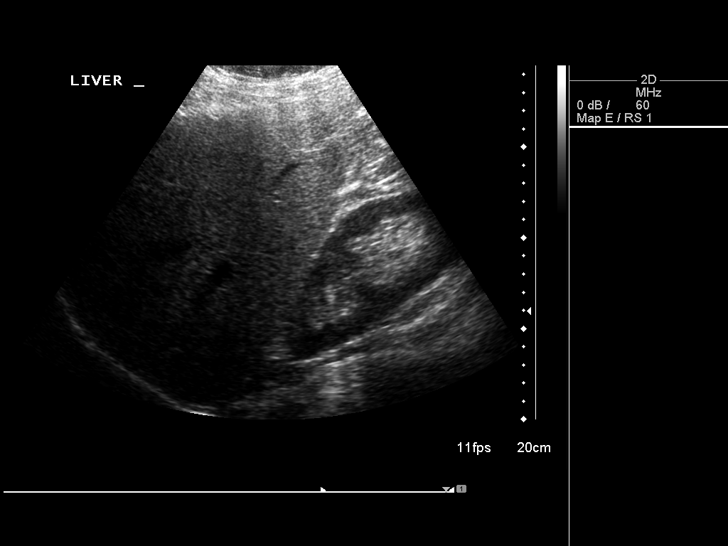
[im 22/30]
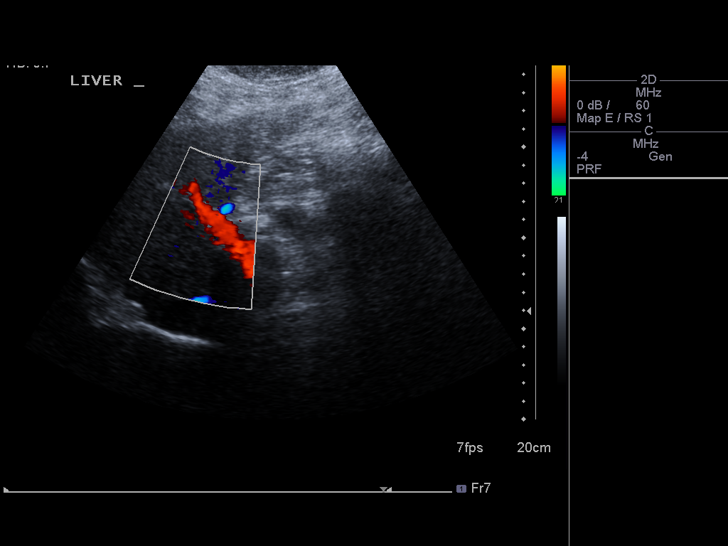
[im 25/30]
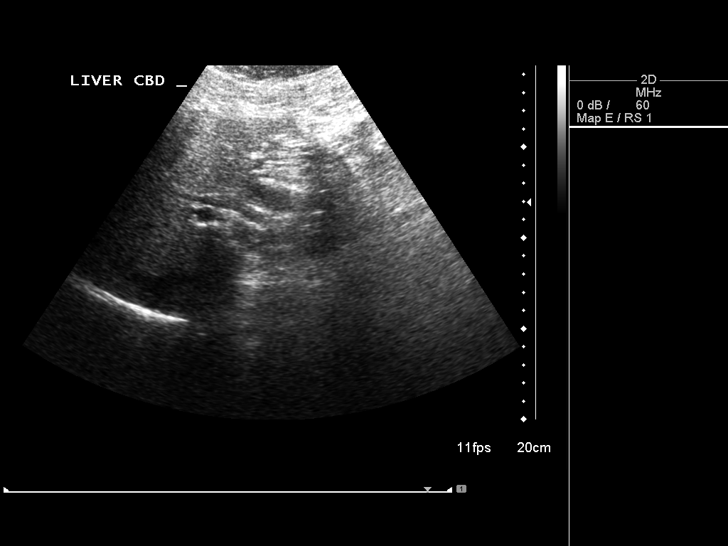
[im 27/30]
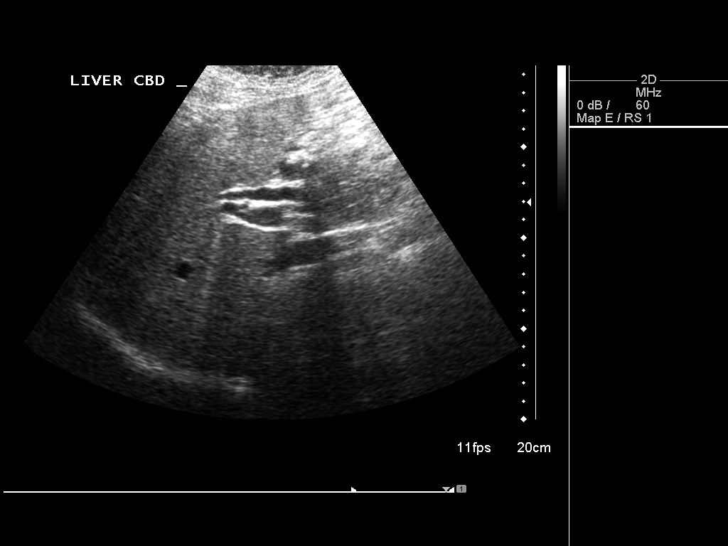
[im 30/30]
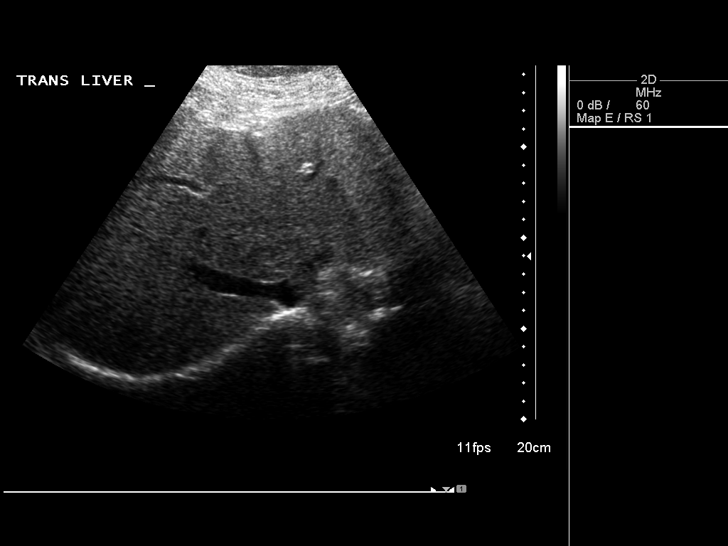

[14 of 25 positions shown; findings below may reference images not displayed]

FINDINGS: Gallbladder:

Surgically absent

Common bile duct:

Diameter: 8 mm in diameter probable postcholecystectomy

Liver:

No focal lesion identified. Within normal limits in parenchymal
echogenicity.
IMPRESSION: 1. Post surgical absent gallbladder. Mild prominent size CBD
measures 8 mm probable postcholecystectomy. Normal liver
echogenicity. No focal hepatic mass.

## 2019-07-30 ENCOUNTER — Encounter: Payer: Self-pay | Admitting: Gynecology

## 2019-11-26 ENCOUNTER — Other Ambulatory Visit: Payer: Self-pay | Admitting: Family Medicine

## 2020-11-25 ENCOUNTER — Other Ambulatory Visit (HOSPITAL_COMMUNITY)
Admission: RE | Admit: 2020-11-25 | Discharge: 2020-11-25 | Disposition: A | Discharge status: Home / Self Care | Payer: Managed Care, Other (non HMO) | Source: Ambulatory Visit | Attending: Family Medicine | Admitting: Family Medicine

## 2020-11-25 DIAGNOSIS — Z0000 Encounter for general adult medical examination without abnormal findings: Secondary | ICD-10-CM | POA: Insufficient documentation

## 2020-12-02 LAB — CYTOLOGY - PAP
Adequacy: ABSENT
Comment: NEGATIVE
Diagnosis: NEGATIVE
High risk HPV: NEGATIVE

## 2021-03-08 ENCOUNTER — Inpatient Hospital Stay: Payer: Managed Care, Other (non HMO) | Admitting: Genetic Counselor

## 2021-03-08 ENCOUNTER — Other Ambulatory Visit: Payer: Self-pay

## 2021-03-08 ENCOUNTER — Inpatient Hospital Stay: Payer: Managed Care, Other (non HMO) | Attending: Oncology

## 2021-03-08 DIAGNOSIS — Z800 Family history of malignant neoplasm of digestive organs: Secondary | ICD-10-CM

## 2021-03-09 ENCOUNTER — Encounter: Payer: Self-pay | Admitting: Genetic Counselor

## 2021-03-09 DIAGNOSIS — Z800 Family history of malignant neoplasm of digestive organs: Secondary | ICD-10-CM

## 2021-03-09 HISTORY — DX: Family history of malignant neoplasm of digestive organs: Z80.0

## 2021-03-09 NOTE — Progress Notes (Signed)
REFERRING PROVIDER: No referring provider defined for this encounter.  PRIMARY PROVIDER:  Donald Prose, MD  PRIMARY REASON FOR VISIT:  1. Family history of colon cancer    HISTORY OF PRESENT ILLNESS:   Regina Clark, a 45 y.o. female, was seen for a [facility] cancer genetics consultation due to a family history of cancer.  Regina Clark presents to clinic today to discuss the possibility of a hereditary predisposition to cancer, to discuss genetic testing, and to further clarify her future cancer risks, as well as potential cancer risks for family members.    Regina Clark is a 45 y.o. female with no personal history of cancer.    CANCER HISTORY:  Oncology History   No history exists.   RISK FACTORS:  Colonoscopy: yes; at least every 5 years per patient; hx of colon polyps.   Past Medical History:  Diagnosis Date  . Colitis 2009   Hx  . Elevated LFTs 12/2014   Negative Hepatitis panel  . Endometrial hyperplasia, simple 2011   Endometrial Bx  . Family history of colon cancer 03/09/2021  . GERD (gastroesophageal reflux disease)    occasional  . History of colon polyps   . Pancreatitis 2007   h/o  . SVD (spontaneous vaginal delivery)    x 1    Past Surgical History:  Procedure Laterality Date  . CESAREAN SECTION     x 2   . CHOLECYSTECTOMY    . LAPAROSCOPIC LYSIS OF ADHESIONS N/A 12/30/2014   Procedure: LAPAROSCOPIC LYSIS OF ADHESIONS;  Surgeon: Anastasio Auerbach, MD;  Location: [facility];  Service: Gynecology;  Laterality: N/A;  . LAPAROSCOPIC OVARIAN CYSTECTOMY Left 12/30/2014   Procedure: POSSIBLE LAPAROSCOPIC OVARIAN CYSTECTOMY;  Surgeon: Anastasio Auerbach, MD;  Location: [facility];  Service: Gynecology;  Laterality: Left;  . LAPAROSCOPY N/A 12/30/2014   Procedure: LAPAROSCOPY DIAGNOSTIC;  Surgeon: Anastasio Auerbach, MD;  Location: [facility];  Service: Gynecology;  Laterality: N/A;  . TONSILLECTOMY    . TUBAL LIGATION    . WISDOM TOOTH EXTRACTION      Social History    Socioeconomic History  . Marital status: Married    Spouse name: Not on file  . Number of children: Not on file  . Years of education: Not on file  . Highest education level: Not on file  Occupational History  . Not on file  Tobacco Use  . Smoking status: Former Smoker    Packs/day: 0.50    Years: 5.00    Pack years: 2.50    Types: Cigarettes    Quit date: 10/07/2004    Years since quitting: 16.4  . Smokeless tobacco: Never Used  Vaping Use  . Vaping Use: Never used  Substance and Sexual Activity  . Alcohol use: Yes    Alcohol/week: 0.0 standard drinks    Comment: Rare  . Drug use: No  . Sexual activity: Yes    Birth control/protection: Surgical    Comment: 1st intercourse 45 yo-More than 5 partners-BTL  Other Topics Concern  . Not on file  Social History Narrative  . Not on file   Social Determinants of Health   Financial Resource Strain: Not on file  Food Insecurity: Not on file  Transportation Needs: Not on file  Physical Activity: Not on file  Stress: Not on file  Social Connections: Not on file     FAMILY HISTORY:  We obtained a detailed, 4-generation family history.  Significant diagnoses are listed below: Family History  Problem  Relation Age of Onset  . Colon cancer Father 24  . Colon cancer Paternal Grandmother 42   Regina Clark has three children.  Her son, Thurmond Butts, is 84 years old.  Her son, Ashok Croon, is 10 was assigned female at birth.  Her other son is 34 years old.  Her children do not have a history of cancer.  She has one full brother, age 1, and one paternal half sister, age 16.  Neither have a cancer history.  Regina Clark's mother died at age 81 and did not have cancer.  She had a maternal uncle with lung cancer and a smoking history.  Regina Clark's father died at age 39 and had colon cancer diagnosed at age 59.  Her paternal grandmother had colon cancer diagnosed at age 18.   Regina Clark is unaware of previous family history of genetic testing for  hereditary cancer risks. Patient's ancestor's are of [place], [place], and [place] descent. There is no reported Ashkenazi Jewish ancestry. There is no known consanguinity.  GENETIC COUNSELING ASSESSMENT: Regina Clark is a 45 y.o. female with a family history of cancer which is somewhat suggestive of a hereditary cancer syndrome and predisposition to cancer given the presence of colon cancer at a young age and in multiple generations in the family. We, therefore, discussed and recommended the following at today's visit.   DISCUSSION: We discussed that 5 - 10% of cancer is hereditary, with most cases of hereditary colon cancer associated with mutations in the Lynch syndrome genes.  There are other genes that can be associated with hereditary colon cancer syndromes.  Type of cancer risk and level of risk are gene-specific.  We discussed that testing is beneficial for several reasons, including knowing about other cancer risks, identifying potential screening and risk-reduction options that may be appropriate, and to understanding if other family members could be at risk for cancer and allowing them to undergo genetic testing.  We reviewed the characteristics, features and inheritance patterns of hereditary cancer syndromes. We also discussed genetic testing, including the appropriate family members to test, the process of testing, insurance coverage and turn-around-time for results. We discussed the implications of a negative, positive, carrier and/or variant of uncertain significant result. We discussed that negative results would be uninformative given that Regina Clark does not have a personal history of cancer. We recommended Regina Clark pursue genetic testing for a panel that contains genes associated with colon cancer.    The Common Hereditary Cancers + RNA Panel offered by [facility] includes sequencing, deletion/duplication, and RNA testing of the following 47 genes: APC, ATM, AXIN2, BARD1, BMPR1A, BRCA1,  BRCA2, BRIP1, CDH1, CDK4*, CDKN2A (p14ARF)*, CDKN2A (p16INK4a)*, CHEK2, CTNNA1, DICER1, EPCAM (Deletion/duplication testing only), GREM1 (promoter region deletion/duplication testing only), KIT, MEN1, MLH1, MSH2, MSH3, MSH6, MUTYH, NBN, NF1, NHTL1, PALB2, PDGFRA*, PMS2, POLD1, POLE, PTEN, RAD50, RAD51C, RAD51D, SDHB, SDHC, SDHD, SMAD4, SMARCA4. STK11, TP53, TSC1, TSC2, and VHL.  The following genes were evaluated for sequence changes only: SDHA and HOXB13 c.251G>A variant only.  RNA analysis is not performed for the * genes.    Based on Regina Clark's family history of cancer, she meets medical criteria for genetic testing. Despite that she meets criteria, she may still have an out of pocket cost. We discussed that if she has an out of pocket cost, the laboratory will reach out to her to discuss patient pay assistance programs and self-pay prices.   We discussed that some people do not want to undergo genetic testing due to  fear of genetic discrimination.  A federal law called the Genetic Information Non-Discrimination Act (GINA) of 2008 helps protect individuals against genetic discrimination based on their genetic test results.  It impacts both health insurance and employment.  With health insurance, it protects against increased premiums, being kicked off insurance or being forced to take a test in order to be insured.  For employment it protects against hiring, firing and promoting decisions based on genetic test results.  GINA does not apply to those in the [employer], those who work for companies with less than 15 employees, and new life insurance or long-term disability insurance policies.  Health status due to a cancer diagnosis is not protected under GINA.  PLAN: After considering the risks, benefits, and limitations, Regina Clark provided informed consent to pursue genetic testing and the blood sample was sent to [facility] for analysis of the Common Hereditary Cancers +RNA Panel. Results  should be available within approximately 3 weeks' time, at which point they will be disclosed by telephone to Regina Clark, as will any additional recommendations warranted by these results. Regina Clark will receive a summary of her genetic counseling visit and a copy of her results once available. This information will also be available in Epic.   Lastly, we encouraged Regina Clark to remain in contact with cancer genetics annually so that we can continuously update the family history and inform her of any changes in cancer genetics and testing that may be of benefit for this family.   Regina Clark questions were answered to her satisfaction today. Our contact information was provided should additional questions or concerns arise. Thank you for the referral and allowing [place] to share in the care of your patient.   Natale Barba M. Joette Catching, [place], [facility] Genetic Counselor [email or web address] (P) [phone]   The patient was seen for a total of 40 minutes in face-to-face genetic counseling.  Drs. Magrinat, Lindi Adie and/or Burr Medico were available to discuss this case as needed.  _______________________________________________________________________ For Office Staff:  Number of people involved in session: 2 Was an Intern/ student involved with case: no

## 2021-03-26 ENCOUNTER — Ambulatory Visit: Payer: Self-pay | Admitting: Genetic Counselor

## 2021-03-26 ENCOUNTER — Telehealth: Payer: Self-pay | Admitting: Genetic Counselor

## 2021-03-26 ENCOUNTER — Encounter: Payer: Self-pay | Admitting: Genetic Counselor

## 2021-03-26 DIAGNOSIS — Z1379 Encounter for other screening for genetic and chromosomal anomalies: Secondary | ICD-10-CM | POA: Insufficient documentation

## 2021-03-26 DIAGNOSIS — Z800 Family history of malignant neoplasm of digestive organs: Secondary | ICD-10-CM

## 2021-03-26 NOTE — Progress Notes (Addendum)
HPI:  Regina Clark was previously seen in the [facility] clinic due to a family history of cancer and concerns regarding a hereditary predisposition to cancer. Please refer to our prior cancer genetics clinic note for more information regarding our discussion, assessment and recommendations, at the time. Regina Clark recent genetic test results were disclosed to her, as were recommendations warranted by these results. These results and recommendations are discussed in more detail below.  CANCER HISTORY:  Oncology History   No history exists.    FAMILY HISTORY:  We obtained a detailed, 4-generation family history.  Significant diagnoses are listed below: Family History  Problem Relation Age of Onset  . Colon cancer Father 19  . Colon cancer Paternal Grandmother 72    Regina Clark has three children.  Her son, Regina Clark, is 26 years old.  Her son, Regina Clark, is 54 was assigned female at birth.  Her other son is 76 years old.  Her children do not have a history of cancer.  She has one full brother, age 97, and one paternal half sister, age 91.  Neither have a cancer history.  Regina Clark's mother died at age 68 and did not have cancer.  She had a maternal uncle with lung cancer and a smoking history.  Regina Clark's father died at age 36 and had colon cancer diagnosed at age 59.  Her paternal grandmother had colon cancer diagnosed at age 38.   Regina Clark is unaware of previous family history of genetic testing for hereditary cancer risks. Patient's ancestor's are of [place], [place], and [place] descent. There is no reported Ashkenazi Jewish ancestry. There is no known consanguinity.  GENETIC TEST RESULTS: Genetic testing reported out on Mar 24, 2021.  The [facility] Common Hereditary Cancers +RNA Panel found no pathogenic mutations.  The Common Hereditary Cancers + RNA Panel offered by [facility] includes sequencing, deletion/duplication, and RNA testing of the following 47 genes: APC, ATM,  AXIN2, BARD1, BMPR1A, BRCA1, BRCA2, BRIP1, CDH1, CDK4*, CDKN2A (p14ARF)*, CDKN2A (p16INK4a)*, CHEK2, CTNNA1, DICER1, EPCAM (Deletion/duplication testing only), GREM1 (promoter region deletion/duplication testing only), KIT, MEN1, MLH1, MSH2, MSH3, MSH6, MUTYH, NBN, NF1, NHTL1, PALB2, PDGFRA*, PMS2, POLD1, POLE, PTEN, RAD50, RAD51C, RAD51D, SDHB, SDHC, SDHD, SMAD4, SMARCA4. STK11, TP53, TSC1, TSC2, and VHL.  The following genes were evaluated for sequence changes only: SDHA and HOXB13 c.251G>A variant only.  RNA analysis is not performed for the * genes.    The test report has been scanned into EPIC and is located under the Molecular Pathology section of the Results Review tab.  A portion of the result report is included below for reference.     We discussed with Regina Clark that because current genetic testing is not perfect, it is possible there may be a gene mutation in one of these genes that current testing cannot detect, but that chance is small.  We also discussed, that there could be another gene that has not yet been discovered, or that we have not yet tested, that is responsible for the cancer diagnoses in the family. It is also possible there is a hereditary cause for the cancer in the family that Regina Clark did not inherit and therefore was not identified in her testing.  Therefore, it is important to remain in touch with cancer genetics in the future so that we can continue to offer Regina Clark the most up to date genetic testing.    ADDITIONAL GENETIC TESTING: There are other genes that are associated with increased  cancer risk that can be analyzed. Should Regina Clark wish to pursue additional genetic testing, we are happy to discuss and coordinate this testing, at any time.    CANCER SCREENING RECOMMENDATIONS: Regina Clark test result is considered negative (normal).  This means that we have not identified a hereditary cause for her family history of cancer at this time.  While  reassuring, this does not definitively rule out a hereditary predisposition to cancer. It is still possible that there could be genetic mutations that are undetectable by current technology. There could be genetic mutations in genes that have not been tested or identified to increase cancer risk.  Therefore, it is recommended she continue to follow the cancer management and screening guidelines provided by her gastroenterologist and primary healthcare provider.   An individual's cancer risk and medical management are not determined by genetic test results alone. Overall cancer risk assessment incorporates additional factors, including personal medical history, family history, and any available genetic information that may result in a personalized plan for cancer prevention and surveillance  RECOMMENDATIONS FOR FAMILY MEMBERS:  Individuals in this family might be at some increased risk of developing cancer, over the general population risk, simply due to the family history of cancer.  We recommended women in this family have a yearly mammogram beginning at age 49, or 17 years younger than the earliest onset of cancer, an annual clinical breast exam, and perform monthly breast self-exams. Women in this family should also have a gynecological exam as recommended by their primary provider.  First degree relatives of those with colon cancer should receive colonoscopies beginning at age 66, or 10 years prior to the earliest diagnosis of colon cancer in the family, and receive colonoscopies at least every 5 years, or as recommended by their gastroenterologist.    It is also possible there is a hereditary cause for the cancer in Regina Clark's family that she did not inherit and therefore was not identified in her.  Based on Ms. Gancarz's family history of colon cancer, we recommended her siblings have genetic counseling and testing. Ms. Polzin will let us know if we can be of any assistance in coordinating genetic  counseling and/or testing for these family members.   FOLLOW-UP: Lastly, we discussed with Regina Clark that cancer genetics is a rapidly advancing field and it is possible that new genetic tests will be appropriate for her and/or her family members in the future. We encouraged her to remain in contact with cancer genetics on an annual basis so we can update her personal and family histories and let her know of advances in cancer genetics that may benefit this family.   Our contact number was provided. Ms. Davoli questions were answered to her satisfaction, and she knows she is welcome to call us at anytime with additional questions or concerns.     Avyon Herendeen M. Joette Catching, [place], [facility] Genetic Counselor [email or web address] (P) [phone]

## 2021-03-26 NOTE — Telephone Encounter (Signed)
Revealed negative genetic testing.  Discussed that we do not know why why there is cancer in the family. It could be sporadic/familial, due to a change in a gene that she did not inherit, due to a different gene that we are not testing, or maybe our current technology may not be able to pick something up.  It will be important for her to keep in contact with genetics to keep up with whether additional testing may be needed.

## 2024-07-08 ENCOUNTER — Ambulatory Visit: Admission: EM | Admit: 2024-07-08 | Discharge: 2024-07-08 | Disposition: A | Discharge status: Home / Self Care

## 2024-07-08 ENCOUNTER — Inpatient Hospital Stay
Admission: RE | Admit: 2024-07-08 | Discharge: 2024-07-08 | Disposition: A | Discharge status: Home / Self Care | Source: Ambulatory Visit | Attending: Family Medicine | Admitting: Family Medicine

## 2024-07-08 DIAGNOSIS — J0100 Acute maxillary sinusitis, unspecified: Secondary | ICD-10-CM | POA: Diagnosis not present

## 2024-07-08 MED ORDER — DOXYCYCLINE HYCLATE 100 MG PO CAPS: 100.0000 mg | ORAL_CAPSULE | Freq: Two times a day (BID) | ORAL | 0 refills | Status: AC

## 2024-07-08 NOTE — ED Triage Notes (Signed)
 Throat pain, right ear itching, right side jaw pain, and body aches x 1 week

## 2024-07-08 NOTE — ED Provider Notes (Signed)
 [facility]    CSN: [id number] Arrival date & time: 07/08/24  1744      History   Chief Complaint Chief Complaint  Patient presents with   Jaw Pain    HPI Regina Clark is a 48 y.o. female.   Patient here today for evaluation of sinus congestion and right sided jaw pain. She reports she is concerned she may have sinus infection. She has tried OTC meds without resolution.   The history is provided by the patient.    Past Medical History:  Diagnosis Date   Colitis 2009   Hx   Elevated LFTs 12/2014   Negative Hepatitis panel   Endometrial hyperplasia, simple 2011   Endometrial Bx   Family history of colon cancer 03/09/2021   GERD (gastroesophageal reflux disease)    occasional   History of colon polyps    Pancreatitis 2007   h/o   SVD (spontaneous vaginal delivery)    x 1    Patient Active Problem List   Diagnosis Date Noted   Genetic testing 03/26/2021   Family history of colon cancer 03/09/2021   Colitis    History of colon polyps     Past Surgical History:  Procedure Laterality Date   CESAREAN SECTION     x 2    CHOLECYSTECTOMY     LAPAROSCOPIC LYSIS OF ADHESIONS N/A 12/30/2014   Procedure: LAPAROSCOPIC LYSIS OF ADHESIONS;  Surgeon: Evalene SHAUNNA Organ, MD;  Location: [facility];  Service: Gynecology;  Laterality: N/A;   LAPAROSCOPIC OVARIAN CYSTECTOMY Left 12/30/2014   Procedure: POSSIBLE LAPAROSCOPIC OVARIAN CYSTECTOMY;  Surgeon: Evalene SHAUNNA Organ, MD;  Location: [facility];  Service: Gynecology;  Laterality: Left;   LAPAROSCOPY N/A 12/30/2014   Procedure: LAPAROSCOPY DIAGNOSTIC;  Surgeon: Evalene SHAUNNA Organ, MD;  Location: [facility];  Service: Gynecology;  Laterality: N/A;   TONSILLECTOMY     TUBAL LIGATION     WISDOM TOOTH EXTRACTION      OB History     Gravida  3   Para  3   Term  3   Preterm      AB      Living  3      SAB      IAB      Ectopic      Multiple      Live Births               Home Medications    Prior  to Admission medications   Medication Sig Start Date End Date Taking? Authorizing Provider  atorvastatin (LIPITOR) 10 MG tablet Take 10 mg by mouth daily. 06/24/24  Yes [provider]  doxycycline  (VIBRAMYCIN ) 100 MG capsule Take 1 capsule (100 mg total) by mouth 2 (two) times daily for 7 days. 07/08/24 07/15/24 Yes Billy Asberry FALCON, PA-C  EMBECTA PEN NEEDLE NANO 2 GEN 32G X 4 MM MISC  07/01/24  Yes [provider]  glipiZIDE (GLUCOTROL XL) 10 MG 24 hr tablet Take 10 mg by mouth 2 (two) times daily. 04/22/24  Yes [provider]  LANTUS SOLOSTAR 100 UNIT/ML Solostar Pen Inject into the skin. 07/07/24  Yes [provider]  lisinopril (ZESTRIL) 10 MG tablet Take 10 mg by mouth daily. 06/24/24  Yes [provider]  metFORMIN (GLUCOPHAGE-XR) 500 MG 24 hr tablet Take 1,000 mg by mouth 2 (two) times daily. 06/07/24  Yes [provider]  sertraline (ZOLOFT) 100 MG tablet Take 100 mg by mouth daily. 06/24/24  Yes [provider]  amphetamine-dextroamphetamine (ADDERALL) 5 MG tablet Take 5 mg by mouth daily.    [provider]  diazepam (VALIUM) 5 MG tablet Take 2.5 mg by mouth every 6 (six) hours as needed for anxiety.    [provider]  Sennosides-Docusate Sodium (STOOL SOFTENER & LAXATIVE PO) Take 1 tablet by mouth daily.    [provider]    Family History Family History  Problem Relation Age of Onset   Hypertension Mother    Kidney failure Mother    Alcoholism Mother    Colon cancer Father 104   Diabetes Father    Colon cancer Paternal Grandmother 95    Social History Social History   Tobacco Use   Smoking status: Former    Current packs/day: 0.00    Average packs/day: 0.5 packs/day for 5.0 years (2.5 ttl pk-yrs)    Types: Cigarettes    Start date: 10/08/1999    Quit date: 10/07/2004    Years since quitting: 19.7   Smokeless tobacco: Never  Vaping Use   Vaping status: Never Used  Substance Use Topics    Alcohol use: Yes    Alcohol/week: 0.0 standard drinks of alcohol    Comment: Rare   Drug use: No     Allergies   Penicillins and Sulfa antibiotics   Review of Systems Review of Systems  Constitutional:  Negative for chills and fever.  HENT:  Positive for congestion, sinus pressure and sore throat. Negative for ear pain.   Eyes:  Negative for discharge and redness.  Respiratory:  Negative for cough, shortness of breath and wheezing.   Gastrointestinal:  Negative for abdominal pain, diarrhea, nausea and vomiting.     Physical Exam Triage Vital Signs ED Triage Vitals  Encounter Vitals Group     BP 07/08/24 1750 119/78     Girls Systolic BP Percentile --      Girls Diastolic BP Percentile --      Boys Systolic BP Percentile --      Boys Diastolic BP Percentile --      Pulse Rate 07/08/24 1750 82     Resp 07/08/24 1750 20     Temp 07/08/24 1750 98.8 F (37.1 C)     Temp Source 07/08/24 1750 Oral     SpO2 07/08/24 1750 93 %     Weight --      Height --      Head Circumference --      Peak Flow --      Pain Score 07/08/24 1748 5     Pain Loc --      Pain Education --      Exclude from Growth Chart --    No data found.  Updated Vital Signs BP 119/78 (BP Location: Right Arm)   Pulse 82   Temp 98.8 F (37.1 C) (Oral)   Resp 20   LMP  (LMP Unknown)   SpO2 93%   Visual Acuity Right Eye Distance:   Left Eye Distance:   Bilateral Distance:    Right Eye Near:   Left Eye Near:    Bilateral Near:     Physical Exam Vitals and nursing note reviewed.  Constitutional:      General: She is not in acute distress.    Appearance: Normal appearance. She is not ill-appearing.  HENT:     Head: Normocephalic and atraumatic.     Nose: Congestion present.     Mouth/Throat:     Mouth:  Mucous membranes are moist.     Pharynx: No oropharyngeal exudate or posterior oropharyngeal erythema.  Eyes:     Conjunctiva/sclera: Conjunctivae normal.  Cardiovascular:     Rate and  Rhythm: Normal rate and regular rhythm.     Heart sounds: Normal heart sounds. No murmur heard. Pulmonary:     Effort: Pulmonary effort is normal. No respiratory distress.     Breath sounds: Normal breath sounds. No wheezing, rhonchi or rales.  Skin:    General: Skin is warm and dry.  Neurological:     Mental Status: She is alert.  Psychiatric:        Mood and Affect: Mood normal.        Thought Content: Thought content normal.      UC Treatments / Results  Labs (all labs ordered are listed, but only abnormal results are displayed) Labs Reviewed - No data to display  EKG   Radiology No results found.  Procedures Procedures (including critical care time)  Medications Ordered in UC Medications - No data to display  Initial Impression / Assessment and Plan / UC Course  I have reviewed the triage vital signs and the nursing notes.  Pertinent labs & imaging results that were available during my care of the patient were reviewed by me and considered in my medical decision making (see chart for details).    Given duration of symptoms will treat to cover sinusitis. Doxycycline  prescribed. Encouraged follow up if no gradual improvement or with any further concerns.   Final Clinical Impressions(s) / UC Diagnoses   Final diagnoses:  Acute non-recurrent maxillary sinusitis   Discharge Instructions   None    ED Prescriptions     Medication Sig Dispense Auth. Provider   doxycycline  (VIBRAMYCIN ) 100 MG capsule Take 1 capsule (100 mg total) by mouth 2 (two) times daily for 7 days. 14 capsule Billy Asberry FALCON, PA-C      PDMP not reviewed this encounter.   Billy Asberry FALCON, PA-C 07/08/24 2000

## 2024-11-02 ENCOUNTER — Ambulatory Visit: Payer: Self-pay
